# Patient Record
Sex: Female | Born: 1985 | Race: Black or African American | Hispanic: No | Marital: Single | Smoking: Never smoker
Health system: Southern US, Community
[De-identification: ages and names within clinical notes are randomized; demographics above are authoritative.]

## PROBLEM LIST (undated history)

## (undated) DIAGNOSIS — G4700 Insomnia, unspecified: Secondary | ICD-10-CM

## (undated) DIAGNOSIS — Q213 Tetralogy of Fallot: Secondary | ICD-10-CM

## (undated) DIAGNOSIS — G43909 Migraine, unspecified, not intractable, without status migrainosus: Secondary | ICD-10-CM

---

## 2016-08-18 ENCOUNTER — Inpatient Hospital Stay (HOSPITAL_COMMUNITY): Payer: Self-pay

## 2016-08-18 ENCOUNTER — Emergency Department (HOSPITAL_COMMUNITY): Payer: Self-pay

## 2016-08-18 ENCOUNTER — Inpatient Hospital Stay (HOSPITAL_COMMUNITY)
Admit: 2016-08-18 | Discharge: 2016-08-18 | Disposition: A | Discharge status: Home / Self Care | Payer: Self-pay | Attending: Emergency Medicine | Admitting: Emergency Medicine

## 2016-08-18 ENCOUNTER — Encounter (HOSPITAL_COMMUNITY): Payer: Self-pay | Admitting: Physician Assistant

## 2016-08-18 ENCOUNTER — Inpatient Hospital Stay (HOSPITAL_COMMUNITY)
Admission: EM | Admit: 2016-08-18 | Discharge: 2016-08-30 | DRG: 296 | Disposition: E | Payer: Self-pay | Attending: Pulmonary Disease | Admitting: Pulmonary Disease

## 2016-08-18 DIAGNOSIS — R748 Abnormal levels of other serum enzymes: Secondary | ICD-10-CM

## 2016-08-18 DIAGNOSIS — Y900 Blood alcohol level of less than 20 mg/100 ml: Secondary | ICD-10-CM | POA: Diagnosis present

## 2016-08-18 DIAGNOSIS — J80 Acute respiratory distress syndrome: Secondary | ICD-10-CM

## 2016-08-18 DIAGNOSIS — J9601 Acute respiratory failure with hypoxia: Secondary | ICD-10-CM | POA: Diagnosis present

## 2016-08-18 DIAGNOSIS — J9690 Respiratory failure, unspecified, unspecified whether with hypoxia or hypercapnia: Secondary | ICD-10-CM

## 2016-08-18 DIAGNOSIS — R740 Nonspecific elevation of levels of transaminase and lactic acid dehydrogenase [LDH]: Secondary | ICD-10-CM | POA: Diagnosis present

## 2016-08-18 DIAGNOSIS — F419 Anxiety disorder, unspecified: Secondary | ICD-10-CM | POA: Diagnosis present

## 2016-08-18 DIAGNOSIS — Z66 Do not resuscitate: Secondary | ICD-10-CM | POA: Diagnosis present

## 2016-08-18 DIAGNOSIS — R739 Hyperglycemia, unspecified: Secondary | ICD-10-CM | POA: Diagnosis present

## 2016-08-18 DIAGNOSIS — N179 Acute kidney failure, unspecified: Secondary | ICD-10-CM

## 2016-08-18 DIAGNOSIS — I469 Cardiac arrest, cause unspecified: Secondary | ICD-10-CM

## 2016-08-18 DIAGNOSIS — G931 Anoxic brain damage, not elsewhere classified: Secondary | ICD-10-CM | POA: Diagnosis present

## 2016-08-18 DIAGNOSIS — N170 Acute kidney failure with tubular necrosis: Secondary | ICD-10-CM | POA: Diagnosis present

## 2016-08-18 DIAGNOSIS — J9600 Acute respiratory failure, unspecified whether with hypoxia or hypercapnia: Secondary | ICD-10-CM

## 2016-08-18 DIAGNOSIS — I4510 Unspecified right bundle-branch block: Secondary | ICD-10-CM | POA: Diagnosis present

## 2016-08-18 DIAGNOSIS — E875 Hyperkalemia: Secondary | ICD-10-CM | POA: Diagnosis not present

## 2016-08-18 DIAGNOSIS — Z8774 Personal history of (corrected) congenital malformations of heart and circulatory system: Secondary | ICD-10-CM

## 2016-08-18 DIAGNOSIS — E872 Acidosis, unspecified: Secondary | ICD-10-CM

## 2016-08-18 DIAGNOSIS — J14 Pneumonia due to Hemophilus influenzae: Secondary | ICD-10-CM | POA: Diagnosis present

## 2016-08-18 DIAGNOSIS — D649 Anemia, unspecified: Secondary | ICD-10-CM | POA: Diagnosis present

## 2016-08-18 DIAGNOSIS — R0902 Hypoxemia: Secondary | ICD-10-CM

## 2016-08-18 DIAGNOSIS — R6520 Severe sepsis without septic shock: Secondary | ICD-10-CM | POA: Diagnosis present

## 2016-08-18 DIAGNOSIS — A419 Sepsis, unspecified organism: Secondary | ICD-10-CM | POA: Diagnosis present

## 2016-08-18 DIAGNOSIS — E8770 Fluid overload, unspecified: Secondary | ICD-10-CM | POA: Diagnosis not present

## 2016-08-18 DIAGNOSIS — G9340 Encephalopathy, unspecified: Secondary | ICD-10-CM

## 2016-08-18 DIAGNOSIS — R778 Other specified abnormalities of plasma proteins: Secondary | ICD-10-CM

## 2016-08-18 DIAGNOSIS — E876 Hypokalemia: Secondary | ICD-10-CM | POA: Diagnosis not present

## 2016-08-18 DIAGNOSIS — E874 Mixed disorder of acid-base balance: Secondary | ICD-10-CM | POA: Diagnosis present

## 2016-08-18 DIAGNOSIS — Q213 Tetralogy of Fallot: Secondary | ICD-10-CM

## 2016-08-18 DIAGNOSIS — D65 Disseminated intravascular coagulation [defibrination syndrome]: Secondary | ICD-10-CM | POA: Diagnosis present

## 2016-08-18 DIAGNOSIS — I351 Nonrheumatic aortic (valve) insufficiency: Secondary | ICD-10-CM | POA: Diagnosis present

## 2016-08-18 DIAGNOSIS — R011 Cardiac murmur, unspecified: Secondary | ICD-10-CM

## 2016-08-18 DIAGNOSIS — E162 Hypoglycemia, unspecified: Secondary | ICD-10-CM | POA: Diagnosis not present

## 2016-08-18 DIAGNOSIS — Z515 Encounter for palliative care: Secondary | ICD-10-CM | POA: Diagnosis present

## 2016-08-18 DIAGNOSIS — K7200 Acute and subacute hepatic failure without coma: Secondary | ICD-10-CM | POA: Diagnosis not present

## 2016-08-18 DIAGNOSIS — Z9119 Patient's noncompliance with other medical treatment and regimen: Secondary | ICD-10-CM

## 2016-08-18 DIAGNOSIS — R7989 Other specified abnormal findings of blood chemistry: Secondary | ICD-10-CM

## 2016-08-18 DIAGNOSIS — G9341 Metabolic encephalopathy: Secondary | ICD-10-CM | POA: Diagnosis present

## 2016-08-18 DIAGNOSIS — D689 Coagulation defect, unspecified: Secondary | ICD-10-CM | POA: Diagnosis present

## 2016-08-18 DIAGNOSIS — L8990 Pressure ulcer of unspecified site, unspecified stage: Secondary | ICD-10-CM | POA: Insufficient documentation

## 2016-08-18 DIAGNOSIS — F10129 Alcohol abuse with intoxication, unspecified: Secondary | ICD-10-CM | POA: Diagnosis present

## 2016-08-18 HISTORY — DX: Migraine, unspecified, not intractable, without status migrainosus: G43.909

## 2016-08-18 HISTORY — DX: Insomnia, unspecified: G47.00

## 2016-08-18 HISTORY — DX: Tetralogy of Fallot: Q21.3

## 2016-08-18 LAB — POCT I-STAT 3, ART BLOOD GAS (G3+)
ACID-BASE DEFICIT: 4 mmol/L — AB (ref 0.0–2.0)
BICARBONATE: 19 mmol/L — AB (ref 20.0–28.0)
O2 SAT: 100 %
PH ART: 7.55 — AB (ref 7.350–7.450)
PO2 ART: 365 mmHg — AB (ref 83.0–108.0)
Patient temperature: 33.1
TCO2: 20 mmol/L (ref 0–100)
pCO2 arterial: 21 mmHg — ABNORMAL LOW (ref 32.0–48.0)

## 2016-08-18 LAB — CBC WITH DIFFERENTIAL/PLATELET
BASOS ABS: 0 10*3/uL (ref 0.0–0.1)
Basophils Relative: 0 %
Eosinophils Absolute: 0 10*3/uL (ref 0.0–0.7)
Eosinophils Relative: 0 %
HEMATOCRIT: 31.4 % — AB (ref 36.0–46.0)
HEMOGLOBIN: 8.5 g/dL — AB (ref 12.0–15.0)
LYMPHS ABS: 1.6 10*3/uL (ref 0.7–4.0)
Lymphocytes Relative: 6 %
MCH: 21.5 pg — AB (ref 26.0–34.0)
MCHC: 27.1 g/dL — AB (ref 30.0–36.0)
MCV: 79.3 fL (ref 78.0–100.0)
MONOS PCT: 6 %
Monocytes Absolute: 1.6 10*3/uL — ABNORMAL HIGH (ref 0.1–1.0)
NEUTROS ABS: 23.1 10*3/uL — AB (ref 1.7–7.7)
Neutrophils Relative %: 88 %
Platelets: 165 10*3/uL (ref 150–400)
RBC: 3.96 MIL/uL (ref 3.87–5.11)
RDW: 18.8 % — ABNORMAL HIGH (ref 11.5–15.5)
WBC: 26.3 10*3/uL — ABNORMAL HIGH (ref 4.0–10.5)

## 2016-08-18 LAB — CBC
HCT: 27.2 % — ABNORMAL LOW (ref 36.0–46.0)
Hemoglobin: 7.7 g/dL — ABNORMAL LOW (ref 12.0–15.0)
MCH: 21.6 pg — AB (ref 26.0–34.0)
MCHC: 28.3 g/dL — AB (ref 30.0–36.0)
MCV: 76.4 fL — ABNORMAL LOW (ref 78.0–100.0)
PLATELETS: 152 10*3/uL (ref 150–400)
RBC: 3.56 MIL/uL — ABNORMAL LOW (ref 3.87–5.11)
RDW: 18.4 % — AB (ref 11.5–15.5)
WBC: 18.8 10*3/uL — ABNORMAL HIGH (ref 4.0–10.5)

## 2016-08-18 LAB — PROTIME-INR
INR: 4.16
INR: 7.41 — AB
INR: 3.21
PROTHROMBIN TIME: 41.3 s — AB (ref 11.4–15.2)
PROTHROMBIN TIME: 33.5 s — AB (ref 11.4–15.2)
Prothrombin Time: 65.5 seconds — ABNORMAL HIGH (ref 11.4–15.2)

## 2016-08-18 LAB — I-STAT CG4 LACTIC ACID, ED: Lactic Acid, Venous: 16.42 mmol/L (ref 0.5–1.9)

## 2016-08-18 LAB — POCT I-STAT, CHEM 8
BUN: 14 mg/dL (ref 6–20)
BUN: 20 mg/dL (ref 6–20)
BUN: 26 mg/dL — ABNORMAL HIGH (ref 6–20)
BUN: 28 mg/dL — AB (ref 6–20)
CALCIUM ION: 0.79 mmol/L — AB (ref 1.15–1.40)
CALCIUM ION: 0.83 mmol/L — AB (ref 1.15–1.40)
CHLORIDE: 96 mmol/L — AB (ref 101–111)
CHLORIDE: 99 mmol/L — AB (ref 101–111)
CREATININE: 4 mg/dL — AB (ref 0.44–1.00)
CREATININE: 2.7 mg/dL — AB (ref 0.44–1.00)
Calcium, Ion: 0.77 mmol/L — CL (ref 1.15–1.40)
Calcium, Ion: 0.64 mmol/L — CL (ref 1.15–1.40)
Chloride: 102 mmol/L (ref 101–111)
Chloride: 93 mmol/L — ABNORMAL LOW (ref 101–111)
Creatinine, Ser: 2.1 mg/dL — ABNORMAL HIGH (ref 0.44–1.00)
Creatinine, Ser: 3.4 mg/dL — ABNORMAL HIGH (ref 0.44–1.00)
GLUCOSE: 387 mg/dL — AB (ref 65–99)
Glucose, Bld: 239 mg/dL — ABNORMAL HIGH (ref 65–99)
Glucose, Bld: 315 mg/dL — ABNORMAL HIGH (ref 65–99)
Glucose, Bld: 404 mg/dL — ABNORMAL HIGH (ref 65–99)
HCT: 23 % — ABNORMAL LOW (ref 36.0–46.0)
HCT: 23 % — ABNORMAL LOW (ref 36.0–46.0)
HEMATOCRIT: 22 % — AB (ref 36.0–46.0)
HEMATOCRIT: 30 % — AB (ref 36.0–46.0)
HEMOGLOBIN: 10.2 g/dL — AB (ref 12.0–15.0)
Hemoglobin: 7.5 g/dL — ABNORMAL LOW (ref 12.0–15.0)
Hemoglobin: 7.8 g/dL — ABNORMAL LOW (ref 12.0–15.0)
Hemoglobin: 7.8 g/dL — ABNORMAL LOW (ref 12.0–15.0)
POTASSIUM: 3.3 mmol/L — AB (ref 3.5–5.1)
Potassium: 3.8 mmol/L (ref 3.5–5.1)
Potassium: 3.5 mmol/L (ref 3.5–5.1)
Potassium: 3 mmol/L — ABNORMAL LOW (ref 3.5–5.1)
SODIUM: 139 mmol/L (ref 135–145)
SODIUM: 139 mmol/L (ref 135–145)
Sodium: 139 mmol/L (ref 135–145)
Sodium: 139 mmol/L (ref 135–145)
TCO2: 12 mmol/L (ref 0–100)
TCO2: 17 mmol/L (ref 0–100)
TCO2: 20 mmol/L (ref 0–100)
TCO2: 23 mmol/L (ref 0–100)

## 2016-08-18 LAB — BASIC METABOLIC PANEL
Anion gap: 21 — ABNORMAL HIGH (ref 5–15)
Anion gap: 24 — ABNORMAL HIGH (ref 5–15)
BUN: 23 mg/dL — ABNORMAL HIGH (ref 6–20)
BUN: 18 mg/dL (ref 6–20)
CALCIUM: 6.8 mg/dL — AB (ref 8.9–10.3)
CALCIUM: 6.1 mg/dL — AB (ref 8.9–10.3)
CO2: 7 mmol/L — AB (ref 22–32)
CO2: 17 mmol/L — AB (ref 22–32)
CREATININE: 4.06 mg/dL — AB (ref 0.44–1.00)
CREATININE: 2.98 mg/dL — AB (ref 0.44–1.00)
Chloride: 108 mmol/L (ref 101–111)
Chloride: 95 mmol/L — ABNORMAL LOW (ref 101–111)
GFR calc Af Amer: 16 mL/min — ABNORMAL LOW (ref >60)
GFR calc Af Amer: 23 mL/min — ABNORMAL LOW (ref >60)
GFR, EST NON AFRICAN AMERICAN: 14 mL/min — AB (ref >60)
GFR, EST NON AFRICAN AMERICAN: 20 mL/min — AB (ref >60)
GLUCOSE: 345 mg/dL — AB (ref 65–99)
GLUCOSE: 326 mg/dL — AB (ref 65–99)
Potassium: 6.1 mmol/L — ABNORMAL HIGH (ref 3.5–5.1)
Potassium: 2.9 mmol/L — ABNORMAL LOW (ref 3.5–5.1)
Sodium: 136 mmol/L (ref 135–145)
Sodium: 136 mmol/L (ref 135–145)

## 2016-08-18 LAB — HCG, QUANTITATIVE, PREGNANCY

## 2016-08-18 LAB — GLUCOSE, CAPILLARY
GLUCOSE-CAPILLARY: 349 mg/dL — AB (ref 65–99)
GLUCOSE-CAPILLARY: 356 mg/dL — AB (ref 65–99)
GLUCOSE-CAPILLARY: 328 mg/dL — AB (ref 65–99)
GLUCOSE-CAPILLARY: 271 mg/dL — AB (ref 65–99)
Glucose-Capillary: 232 mg/dL — ABNORMAL HIGH (ref 65–99)
Glucose-Capillary: 249 mg/dL — ABNORMAL HIGH (ref 65–99)
Glucose-Capillary: 258 mg/dL — ABNORMAL HIGH (ref 65–99)
Glucose-Capillary: 282 mg/dL — ABNORMAL HIGH (ref 65–99)
Glucose-Capillary: 316 mg/dL — ABNORMAL HIGH (ref 65–99)
Glucose-Capillary: 329 mg/dL — ABNORMAL HIGH (ref 65–99)
Glucose-Capillary: 360 mg/dL — ABNORMAL HIGH (ref 65–99)
Glucose-Capillary: 379 mg/dL — ABNORMAL HIGH (ref 65–99)
Glucose-Capillary: 382 mg/dL — ABNORMAL HIGH (ref 65–99)
Glucose-Capillary: 387 mg/dL — ABNORMAL HIGH (ref 65–99)
Glucose-Capillary: 407 mg/dL — ABNORMAL HIGH (ref 65–99)

## 2016-08-18 LAB — LACTIC ACID, PLASMA: Lactic Acid, Venous: 13.8 mmol/L (ref 0.5–1.9)

## 2016-08-18 LAB — I-STAT CHEM 8, ED
BUN: 39 mg/dL — ABNORMAL HIGH (ref 6–20)
CHLORIDE: 100 mmol/L — AB (ref 101–111)
CREATININE: 4.4 mg/dL — AB (ref 0.44–1.00)
Calcium, Ion: 0.78 mmol/L — CL (ref 1.15–1.40)
GLUCOSE: 256 mg/dL — AB (ref 65–99)
HCT: 32 % — ABNORMAL LOW (ref 36.0–46.0)
Hemoglobin: 10.9 g/dL — ABNORMAL LOW (ref 12.0–15.0)
POTASSIUM: 5.6 mmol/L — AB (ref 3.5–5.1)
Sodium: 134 mmol/L — ABNORMAL LOW (ref 135–145)
TCO2: 12 mmol/L (ref 0–100)

## 2016-08-18 LAB — RAPID URINE DRUG SCREEN, HOSP PERFORMED
AMPHETAMINES: NOT DETECTED
Amphetamines: NOT DETECTED
BARBITURATES: NOT DETECTED
Barbiturates: NOT DETECTED
Benzodiazepines: POSITIVE — AB
Benzodiazepines: POSITIVE — AB
COCAINE: NOT DETECTED
COCAINE: NOT DETECTED
OPIATES: NOT DETECTED
Opiates: NOT DETECTED
TETRAHYDROCANNABINOL: POSITIVE — AB
Tetrahydrocannabinol: POSITIVE — AB

## 2016-08-18 LAB — D-DIMER, QUANTITATIVE (NOT AT ARMC)

## 2016-08-18 LAB — I-STAT ARTERIAL BLOOD GAS, ED
ACID-BASE DEFICIT: 24 mmol/L — AB (ref 0.0–2.0)
Bicarbonate: 7.4 mmol/L — ABNORMAL LOW (ref 20.0–28.0)
O2 SAT: 75 %
PCO2 ART: 45.6 mmHg (ref 32.0–48.0)
PH ART: 6.819 — AB (ref 7.350–7.450)
PO2 ART: 72 mmHg — AB (ref 83.0–108.0)
Patient temperature: 98.6
TCO2: 9 mmol/L (ref 0–100)

## 2016-08-18 LAB — COMPREHENSIVE METABOLIC PANEL
ALK PHOS: 66 U/L (ref 38–126)
ALT: 1983 U/L — ABNORMAL HIGH (ref 14–54)
ANION GAP: 24 — AB (ref 5–15)
AST: 6017 U/L — ABNORMAL HIGH (ref 15–41)
Albumin: 2.7 g/dL — ABNORMAL LOW (ref 3.5–5.0)
BILIRUBIN TOTAL: 0.5 mg/dL (ref 0.3–1.2)
BUN: 26 mg/dL — ABNORMAL HIGH (ref 6–20)
CALCIUM: 6.5 mg/dL — AB (ref 8.9–10.3)
CO2: 11 mmol/L — ABNORMAL LOW (ref 22–32)
Chloride: 101 mmol/L (ref 101–111)
Creatinine, Ser: 4.39 mg/dL — ABNORMAL HIGH (ref 0.44–1.00)
GFR calc non Af Amer: 13 mL/min — ABNORMAL LOW (ref >60)
GFR, EST AFRICAN AMERICAN: 15 mL/min — AB (ref >60)
Glucose, Bld: 287 mg/dL — ABNORMAL HIGH (ref 65–99)
Potassium: 5.9 mmol/L — ABNORMAL HIGH (ref 3.5–5.1)
SODIUM: 136 mmol/L (ref 135–145)
TOTAL PROTEIN: 5.3 g/dL — AB (ref 6.5–8.1)

## 2016-08-18 LAB — URINALYSIS, ROUTINE W REFLEX MICROSCOPIC
Glucose, UA: NEGATIVE mg/dL
KETONES UR: 15 mg/dL — AB
NITRITE: POSITIVE — AB
PH: 5 (ref 5.0–8.0)
Protein, ur: 100 mg/dL — AB
SPECIFIC GRAVITY, URINE: 1.018 (ref 1.005–1.030)

## 2016-08-18 LAB — PROCALCITONIN: PROCALCITONIN: 49.68 ng/mL

## 2016-08-18 LAB — ECHOCARDIOGRAM COMPLETE: Weight: 2000 oz

## 2016-08-18 LAB — TROPONIN I
Troponin I: 3.27 ng/mL (ref <0.03)
Troponin I: 5 ng/mL (ref <0.03)
Troponin I: 6.18 ng/mL (ref <0.03)

## 2016-08-18 LAB — RENAL FUNCTION PANEL
ANION GAP: 21 — AB (ref 5–15)
Albumin: 1.8 g/dL — ABNORMAL LOW (ref 3.5–5.0)
BUN: 24 mg/dL — ABNORMAL HIGH (ref 6–20)
CO2: 11 mmol/L — AB (ref 22–32)
Calcium: 5.4 mg/dL — CL (ref 8.9–10.3)
Chloride: 104 mmol/L (ref 101–111)
Creatinine, Ser: 3.55 mg/dL — ABNORMAL HIGH (ref 0.44–1.00)
GFR calc Af Amer: 19 mL/min — ABNORMAL LOW (ref >60)
GFR calc non Af Amer: 16 mL/min — ABNORMAL LOW (ref >60)
GLUCOSE: 404 mg/dL — AB (ref 65–99)
POTASSIUM: 3.2 mmol/L — AB (ref 3.5–5.1)
Phosphorus: 5.8 mg/dL — ABNORMAL HIGH (ref 2.5–4.6)
Sodium: 136 mmol/L (ref 135–145)

## 2016-08-18 LAB — CBG MONITORING, ED
GLUCOSE-CAPILLARY: 59 mg/dL — AB (ref 65–99)
Glucose-Capillary: 230 mg/dL — ABNORMAL HIGH (ref 65–99)

## 2016-08-18 LAB — SALICYLATE LEVEL
SALICYLATE LVL: 13.1 mg/dL (ref 2.8–30.0)
Salicylate Lvl: <7 mg/dL (ref 2.8–30.0)

## 2016-08-18 LAB — ACETAMINOPHEN LEVEL: Acetaminophen (Tylenol), Serum: <10 ug/mL — ABNORMAL LOW (ref 10–30)

## 2016-08-18 LAB — I-STAT BETA HCG BLOOD, ED (MC, WL, AP ONLY)

## 2016-08-18 LAB — URINE MICROSCOPIC-ADD ON

## 2016-08-18 LAB — I-STAT TROPONIN, ED: Troponin i, poc: 2.77 ng/mL (ref 0.00–0.08)

## 2016-08-18 LAB — APTT
APTT: 65 s — AB (ref 24–36)
aPTT: 65 seconds — ABNORMAL HIGH (ref 24–36)

## 2016-08-18 LAB — CK: Total CK: 9422 U/L — ABNORMAL HIGH (ref 38–234)

## 2016-08-18 LAB — ETHANOL: Alcohol, Ethyl (B): 18 mg/dL — ABNORMAL HIGH (ref <5)

## 2016-08-18 LAB — ABO/RH: ABO/RH(D): O POS

## 2016-08-18 LAB — MRSA PCR SCREENING: MRSA BY PCR: NEGATIVE

## 2016-08-18 LAB — PREGNANCY, URINE: Preg Test, Ur: NEGATIVE

## 2016-08-18 MED ORDER — VANCOMYCIN HCL 500 MG IV SOLR
500.0000 mg | INTRAVENOUS | Status: DC
  Administered 2016-08-19 – 2016-08-20 (×2): 500 mg via INTRAVENOUS
  Filled 2016-08-18 (×3): qty 500

## 2016-08-18 MED ORDER — CHLORHEXIDINE GLUCONATE 0.12% ORAL RINSE (MEDLINE KIT)
15.0000 mL | Freq: Two times a day (BID) | OROMUCOSAL | Status: DC
  Administered 2016-08-18 – 2016-08-22 (×9): 15 mL via OROMUCOSAL

## 2016-08-18 MED ORDER — POTASSIUM CHLORIDE 10 MEQ/50ML IV SOLN
10.0000 meq | INTRAVENOUS | Status: AC
  Administered 2016-08-18 (×4): 10 meq via INTRAVENOUS
  Filled 2016-08-18 (×4): qty 50

## 2016-08-18 MED ORDER — SODIUM BICARBONATE 8.4 % IV SOLN
INTRAVENOUS | Status: DC
  Administered 2016-08-18 – 2016-08-19 (×4): via INTRAVENOUS
  Filled 2016-08-18 (×12): qty 150

## 2016-08-18 MED ORDER — PIPERACILLIN-TAZOBACTAM 3.375 G IVPB 30 MIN
3.3750 g | Freq: Once | INTRAVENOUS | Status: AC
  Administered 2016-08-18: 3.375 g via INTRAVENOUS
  Filled 2016-08-18: qty 50

## 2016-08-18 MED ORDER — MIDAZOLAM BOLUS VIA INFUSION
2.0000 mg | INTRAVENOUS | Status: DC | PRN
  Filled 2016-08-18: qty 2

## 2016-08-18 MED ORDER — SODIUM CHLORIDE 0.9 % IV BOLUS (SEPSIS)
1000.0000 mL | Freq: Once | INTRAVENOUS | Status: AC
  Administered 2016-08-18: 1000 mL via INTRAVENOUS

## 2016-08-18 MED ORDER — NOREPINEPHRINE BITARTRATE 1 MG/ML IV SOLN
0.0000 ug/min | INTRAVENOUS | Status: DC
  Filled 2016-08-18: qty 4

## 2016-08-18 MED ORDER — SODIUM BICARBONATE 8.4 % IV SOLN
INTRAVENOUS | Status: AC
  Administered 2016-08-18: 50 meq
  Filled 2016-08-18: qty 100

## 2016-08-18 MED ORDER — CISATRACURIUM BESYLATE (PF) 200 MG/20ML IV SOLN
1.0000 ug/kg/min | INTRAVENOUS | Status: DC
  Administered 2016-08-18: 1 ug/kg/min via INTRAVENOUS
  Filled 2016-08-18 (×2): qty 20

## 2016-08-18 MED ORDER — SODIUM CHLORIDE 0.9 % IV SOLN: 1.0000 g | Freq: Once | INTRAVENOUS | Status: DC

## 2016-08-18 MED ORDER — FENTANYL BOLUS VIA INFUSION
50.0000 ug | INTRAVENOUS | Status: DC | PRN
Start: 2016-08-18 — End: 2016-08-20
  Filled 2016-08-18: qty 50

## 2016-08-18 MED ORDER — NOREPINEPHRINE BITARTRATE 1 MG/ML IV SOLN
0.0000 ug/min | Freq: Once | INTRAVENOUS | Status: DC
  Filled 2016-08-18: qty 4

## 2016-08-18 MED ORDER — STERILE WATER FOR INJECTION IV SOLN
INTRAVENOUS | Status: DC
  Administered 2016-08-18: 14:00:00 via INTRAVENOUS_CENTRAL
  Filled 2016-08-18 (×4): qty 150

## 2016-08-18 MED ORDER — PRISMASOL BGK 4/2.5 32-4-2.5 MEQ/L IV SOLN
INTRAVENOUS | Status: DC
  Administered 2016-08-18 – 2016-08-19 (×9): via INTRAVENOUS_CENTRAL
  Filled 2016-08-18 (×15): qty 5000

## 2016-08-18 MED ORDER — SODIUM CHLORIDE 0.9 % IV SOLN
INTRAVENOUS | Status: DC
  Administered 2016-08-18: 09:00:00 via INTRAVENOUS

## 2016-08-18 MED ORDER — SODIUM CHLORIDE 0.9 % IV SOLN
INTRAVENOUS | Status: DC
  Administered 2016-08-18: 2.7 [IU]/h via INTRAVENOUS
  Filled 2016-08-18 (×2): qty 2.5

## 2016-08-18 MED ORDER — PRISMASOL BGK 4/2.5 32-4-2.5 MEQ/L IV SOLN
INTRAVENOUS | Status: DC
  Administered 2016-08-18: 20:00:00 via INTRAVENOUS_CENTRAL
  Filled 2016-08-18 (×2): qty 5000

## 2016-08-18 MED ORDER — CISATRACURIUM BOLUS VIA INFUSION
0.0500 mg/kg | INTRAVENOUS | Status: DC | PRN
Start: 2016-08-18 — End: 2016-08-20
  Filled 2016-08-18: qty 3

## 2016-08-18 MED ORDER — SODIUM BICARBONATE 8.4 % IV SOLN
50.0000 meq | Freq: Once | INTRAVENOUS | Status: AC
  Administered 2016-08-18: 50 meq via INTRAVENOUS

## 2016-08-18 MED ORDER — SODIUM CHLORIDE 0.9 % FOR CRRT
INTRAVENOUS_CENTRAL | Status: DC | PRN
  Filled 2016-08-18: qty 1000

## 2016-08-18 MED ORDER — FENTANYL 2500MCG IN NS 250ML (10MCG/ML) PREMIX INFUSION
100.0000 ug/h | INTRAVENOUS | Status: DC
  Administered 2016-08-18: 100 ug/h via INTRAVENOUS
  Administered 2016-08-19: 200 ug/h via INTRAVENOUS
  Filled 2016-08-18 (×2): qty 250

## 2016-08-18 MED ORDER — SODIUM BICARBONATE 8.4 % IV SOLN
100.0000 meq | Freq: Once | INTRAVENOUS | Status: AC
  Administered 2016-08-18: 50 meq via INTRAVENOUS

## 2016-08-18 MED ORDER — ORAL CARE MOUTH RINSE
15.0000 mL | OROMUCOSAL | Status: DC
  Administered 2016-08-18 – 2016-08-22 (×41): 15 mL via OROMUCOSAL

## 2016-08-18 MED ORDER — FENTANYL CITRATE (PF) 2500 MCG/50ML IJ SOLN
100.0000 ug/h | INTRAMUSCULAR | Status: DC
  Administered 2016-08-18: 100 ug/h via INTRAVENOUS
  Filled 2016-08-18 (×2): qty 50

## 2016-08-18 MED ORDER — SODIUM CHLORIDE 0.9 % IV SOLN
1.0000 g | Freq: Once | INTRAVENOUS | Status: AC
  Administered 2016-08-18: 1 g via INTRAVENOUS
  Filled 2016-08-18: qty 10

## 2016-08-18 MED ORDER — SODIUM CHLORIDE 0.9 % IV BOLUS (SEPSIS): 1000.0000 mL | Freq: Once | INTRAVENOUS | Status: DC

## 2016-08-18 MED ORDER — SODIUM CHLORIDE 0.9 % IV SOLN
Freq: Once | INTRAVENOUS | Status: AC
  Administered 2016-08-18: 11:00:00 via INTRAVENOUS

## 2016-08-18 MED ORDER — SODIUM CHLORIDE 0.9 % IV SOLN
Freq: Once | INTRAVENOUS | Status: AC
  Administered 2016-08-18: 16:00:00 via INTRAVENOUS

## 2016-08-18 MED ORDER — SODIUM CHLORIDE 0.9 % IV SOLN
2.0000 g | Freq: Once | INTRAVENOUS | Status: AC
  Administered 2016-08-18: 2 g via INTRAVENOUS
  Filled 2016-08-18: qty 20

## 2016-08-18 MED ORDER — FAMOTIDINE IN NACL 20-0.9 MG/50ML-% IV SOLN
20.0000 mg | Freq: Two times a day (BID) | INTRAVENOUS | Status: DC
  Administered 2016-08-18: 20 mg via INTRAVENOUS
  Filled 2016-08-18: qty 50

## 2016-08-18 MED ORDER — SODIUM BICARBONATE 8.4 % IV SOLN: 50.0000 meq | Freq: Once | INTRAVENOUS | Status: DC

## 2016-08-18 MED ORDER — FAMOTIDINE IN NACL 20-0.9 MG/50ML-% IV SOLN
20.0000 mg | INTRAVENOUS | Status: DC
  Administered 2016-08-19: 20 mg via INTRAVENOUS
  Filled 2016-08-18: qty 50

## 2016-08-18 MED ORDER — ARTIFICIAL TEARS OP OINT
1.0000 "application " | TOPICAL_OINTMENT | Freq: Three times a day (TID) | OPHTHALMIC | Status: DC
  Administered 2016-08-18 – 2016-08-19 (×5): 1 via OPHTHALMIC
  Filled 2016-08-18: qty 3.5

## 2016-08-18 MED ORDER — CALCIUM CHLORIDE 10 % IV SOLN
INTRAVENOUS | Status: AC
  Administered 2016-08-18: 1000 mg
  Filled 2016-08-18: qty 10

## 2016-08-18 MED ORDER — MIDAZOLAM HCL 2 MG/2ML IJ SOLN: 2.0000 mg | Freq: Once | INTRAMUSCULAR | Status: AC

## 2016-08-18 MED ORDER — NOREPINEPHRINE BITARTRATE 1 MG/ML IV SOLN
0.0000 ug/min | Freq: Once | INTRAVENOUS | Status: AC
  Administered 2016-08-18: 10 ug/min via INTRAVENOUS

## 2016-08-18 MED ORDER — PIPERACILLIN-TAZOBACTAM IN DEX 2-0.25 GM/50ML IV SOLN
2.2500 g | Freq: Four times a day (QID) | INTRAVENOUS | Status: DC
  Administered 2016-08-18 – 2016-08-21 (×11): 2.25 g via INTRAVENOUS
  Filled 2016-08-18 (×14): qty 50

## 2016-08-18 MED ORDER — HEPARIN SODIUM (PORCINE) 5000 UNIT/ML IJ SOLN: 5000.0000 [IU] | Freq: Three times a day (TID) | INTRAMUSCULAR | Status: DC

## 2016-08-18 MED ORDER — HEPARIN SODIUM (PORCINE) 1000 UNIT/ML DIALYSIS
1000.0000 [IU] | INTRAMUSCULAR | Status: DC | PRN
  Administered 2016-08-21 – 2016-08-22 (×2): 2800 [IU] via INTRAVENOUS_CENTRAL
  Filled 2016-08-18: qty 6
  Filled 2016-08-18: qty 3
  Filled 2016-08-18: qty 4
  Filled 2016-08-18: qty 6
  Filled 2016-08-18: qty 4
  Filled 2016-08-18: qty 6

## 2016-08-18 MED ORDER — DEXTROSE 50 % IV SOLN
1.0000 | Freq: Once | INTRAVENOUS | Status: DC
Start: 2016-08-18 — End: 2016-08-18

## 2016-08-18 MED ORDER — SODIUM CHLORIDE 0.9 % IV SOLN
INTRAVENOUS | Status: DC
  Administered 2016-08-18: 07:00:00 via INTRAVENOUS

## 2016-08-18 MED ORDER — DEXTROSE 5 % IV SOLN
0.0000 ug/min | INTRAVENOUS | Status: DC
  Administered 2016-08-18 (×2): 7 ug/min via INTRAVENOUS
  Administered 2016-08-20: 5 ug/min via INTRAVENOUS
  Filled 2016-08-18 (×2): qty 16

## 2016-08-18 MED ORDER — DEXTROSE 50 % IV SOLN
INTRAVENOUS | Status: AC
  Filled 2016-08-18: qty 50

## 2016-08-18 MED ORDER — FENTANYL CITRATE (PF) 100 MCG/2ML IJ SOLN: 100.0000 ug | Freq: Once | INTRAMUSCULAR | Status: AC

## 2016-08-18 MED ORDER — SODIUM CHLORIDE 0.9 % IV SOLN
2.0000 mg/h | INTRAVENOUS | Status: DC
  Administered 2016-08-18 (×2): 2 mg/h via INTRAVENOUS
  Administered 2016-08-19 (×2): 6 mg/h via INTRAVENOUS
  Filled 2016-08-18 (×4): qty 10

## 2016-08-18 MED ORDER — ASPIRIN 300 MG RE SUPP
300.0000 mg | RECTAL | Status: AC
  Administered 2016-08-18: 300 mg via RECTAL
  Filled 2016-08-18: qty 1

## 2016-08-18 MED ORDER — VANCOMYCIN HCL IN DEXTROSE 1-5 GM/200ML-% IV SOLN
1000.0000 mg | Freq: Once | INTRAVENOUS | Status: AC
  Administered 2016-08-18: 1000 mg via INTRAVENOUS
  Filled 2016-08-18: qty 200

## 2016-08-18 MED ORDER — DEXTROSE 50 % IV SOLN
1.0000 | Freq: Once | INTRAVENOUS | Status: AC
  Administered 2016-08-18: 50 mL via INTRAVENOUS

## 2016-08-18 MED ORDER — CISATRACURIUM BOLUS VIA INFUSION
0.1000 mg/kg | Freq: Once | INTRAVENOUS | Status: AC
  Administered 2016-08-18: 5.7 mg via INTRAVENOUS
  Filled 2016-08-18: qty 6

## 2016-08-18 MED ORDER — SODIUM BICARBONATE 8.4 % IV SOLN
INTRAVENOUS | Status: AC
  Filled 2016-08-18: qty 50

## 2016-08-18 NOTE — Procedures (Signed)
Central Venous Catheter Insertion Procedure Note Posey ReaJenae Wieser [id number] 1986-05-29  Procedure: Insertion of Central Venous Catheter Indications: Assessment of intravascular volume, Drug and/or fluid administration and Frequent blood sampling  Procedure Details Consent: Unable to obtain consent because of emergent medical necessity. Time Out: Verified patient identification, verified procedure, site/side was marked, verified correct patient position, special equipment/implants available, medications/allergies/relevent history reviewed, required imaging and test results available.  Performed  Maximum sterile technique was used including antiseptics, cap, gloves, gown, hand hygiene, mask and sheet. Skin prep: Chlorhexidine; local anesthetic administered A antimicrobial bonded/coated triple lumen catheter was placed in the left internal jugular vein using the Seldinger technique. Ultrasound guidance used.Yes.   Catheter placed to 20 cm. Blood aspirated via all 3 ports and then flushed x 3. Line sutured x 2 and dressing applied.  Evaluation Blood flow good Complications: No apparent complications Patient did tolerate procedure well. Chest X-ray ordered to verify placement.  CXR: pending.  Brett CanalesSteve Minor ACNP [facility] PCCM Pager [phone] till 3 pm If no answer page [phone] 08/13/2016, 7:58 AM

## 2016-08-18 NOTE — ED Notes (Signed)
Brett CanalesSteve minor critical care at bedside placing central line

## 2016-08-18 NOTE — Progress Notes (Signed)
Initial Nutrition Assessment  INTERVENTION:   Once rewarmed recommend: Vital AF 1.2 @ 50 ml/hr (1200 ml/day) Provides: 1440 kcal, 90 grams protein, and 973 ml H2O.   NUTRITION DIAGNOSIS:   Inadequate oral intake related to inability to eat as evidenced by NPO status.  GOAL:   Patient will meet greater than or equal to 90% of their needs  MONITOR:   I & O's, Labs, Vent status  REASON FOR ASSESSMENT:   Ventilator    ASSESSMENT:   Pt with unknown cardiac hx admitted for cardiac arrest with unknown downtime.   Pt living with roommate who is not here, no family yet.   Patient is currently intubated on ventilator support MV: 12 L/min Temp (24hrs), Avg:89.9 F (32.2 C), Min:88.3 F (31.3 C), Max:91.4 F (33 C)  Medications reviewed and include: levophed, Na bicarb Labs reviewed: Na 134, K+ 5.6, lactic acid: 16.42 CBG: 349 Nutrition-Focused physical exam completed. Findings are no fat depletion, no muscle depletion, and mild edema.  OG tube   Diet Order:    NPO  Skin:  Reviewed, no issues  Last BM:  unknown  Height:   Ht Readings from Last 1 Encounters:  08/14/16 5\' 3"  (1.6 m)    Weight:   Wt Readings from Last 1 Encounters:  08/14/16 110 lb 14.3 oz (50.3 kg)    Ideal Body Weight:  52.2 kg  BMI:  Body mass index is 19.64 kg/m.  Estimated Nutritional Needs:   Kcal:  1489  Protein:  70-85 grams  Fluid:  > 1.5 L/day  EDUCATION NEEDS:   No education needs identified at this time  Kendell BaneHeather Rajni Holsworth RD, LDN, CNSC [phone] Pager [phone] After Hours Pager

## 2016-08-18 NOTE — Progress Notes (Signed)
Preliminary results by tech: Bilateral lower extremity duplex complete. Visualized lower extremity veins negative for deep and superficial vein thrombosis. HD Cath noted in left groin. Bandages obscuring visualization of right CFV/GSV. Negative for baker cysts bilaterally. Levin Baconlaire Veto Macqueen- RDMS, RVT 1:52 PM  02/08/2016

## 2016-08-18 NOTE — Procedures (Signed)
ELECTROENCEPHALOGRAM REPORT  Date of Study: 08/15/2016  Patient's Name: Barbara [name] MRN: [id number] Date of Birth: 1986/01/27  Referring Provider: Dr. Max Fickleouglas McQuaid  Clinical History: This is a 30 year old woman s/p cardiac arrest, on hypothermia protocol  Medications: fentaNYL (SUBLIMAZE) 2,500 mcg in sodium chloride 0.9 % 250 mL (10 mcg/mL) infusion midazolam  cisatracurium (NIMBEX) bolus via infusion 2.8 mg  famotidine (PEPCID) IVPB 20 mg premix  insulin regular (NOVOLIN R,HUMULIN R) 250 Units in sodium chloride 0.9 % 250 mL (1 Units/mL) infusion  norepinephrine (LEVOPHED) 4 mg in dextrose 5 % 250 mL (0.016 mg/mL) infusion   Technical Summary: A multichannel digital EEG recording measured by the international 10-20 system with electrodes applied with paste and impedances below 5000 ohms performed in our laboratory with EKG monitoring in an intubated and sedated patient on hypothermia protocol at 32.7 degrees Celsius.  Hyperventilation and photic stimulation were not performed.  The digital EEG was referentially recorded, reformatted, and digitally filtered in a variety of bipolar and referential montages for optimal display.    Description: The patient is intubated and sedated on Fentanyl and Versed during the recording on cooling protocol. There is loss of normal background activity. The records read at a sensitivity of 3 uV/mm shows diffuse suppression of background activity. There is no spontaneous reactivity or reactivity noted with stimulation. Hyperventilation and photic stimulation were not performed. There were no epileptiform discharges or electrographic seizures seen.   EKG lead was unremarkable.  Impression: This EEG is markedly abnormal due to diffuse background suppression and lack of EEG reactivity with stimulation.  Clinical Correlation of the above findings indicates severe diffuse cerebral dysfunction that is non-specific in etiology and can be seen in the  setting of anoxic/ischemic injury, toxic/metabolic encephalopathies, or medication effect. In the absence of CNS active, sedating, or anesthetic medications, this suggests a poor prognosis. Clinical correlation is advised.  Patrcia DollyKaren Aquino, M.D.

## 2016-08-18 NOTE — Progress Notes (Signed)
   08/21/2016 0600  Clinical Encounter Type  Visited With Family  Visit Type Initial;Code;ED  Referral From Nurse  Spiritual Encounters  Spiritual Needs Emotional  Stress Factors  Patient Stress Factors None identified  Family Stress Factors Exhausted    Chaplain responded to page from Ed. Pt post CPR in critical condition. Roommate from transitional housing facility reported arrest and was present for emotional support for patient. No family present, mom en route from unknown location. Escorted friend to consult B and awaiting physician's updates.
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
## 2016-08-18 NOTE — Progress Notes (Signed)
Reviewed updated history with Dr. 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 At present time no new recs to add other than to continue supportive care. EEG results noted. Dayna Dunn PA-C

## 2016-08-18 NOTE — Progress Notes (Signed)
EEG completed; results pending.    

## 2016-08-18 NOTE — ED Notes (Signed)
ICU nurses attempted foley x3. Vaginal discharge noted.

## 2016-08-18 NOTE — ED Notes (Signed)
Pads placed on pt 

## 2016-08-18 NOTE — Progress Notes (Signed)
RT attempted x 2 for radial ABG without success.  No pulses located using a doppler. CCM notified.

## 2016-08-18 NOTE — ED Notes (Signed)
Amanda stemi team at bedside

## 2016-08-18 NOTE — Progress Notes (Signed)
Foley catheter attempted three times by two different RN Rocco Pauls(Sheyenne Konz & Devota PaceKaylee Quick) Unable to visualize or place in urethra.  Multiple perineal care down with discharge and swelling noted and perineal area easily irritated with some bleeding after wiping/cleaning. MD advised.

## 2016-08-18 NOTE — Consult Note (Addendum)
Referring Provider: No ref. provider found Primary Care Physician:  No primary care provider on file. Primary Nephrologist:     Reason for Consultation:  Metabolic acidosis acute renal failure and hyperkalemia   HPI:  This is a young lady with history of Tetralogy of Fallot  That was found down for an unspecified length of time. She  Was found at 5.30 am by a roommate and EMS was called. She was found to be in PEA arrest and underwent 3 doses of Epi and 3shocks VF and VT  And was found to be in sinus tachycardia . She also received D 10 and also Narcan. Bottle of ativan found in room and EtOH level of 18.    Past Medical History:  Diagnosis Date  . Insomnia   . Migraines     No past surgical history on file.  Prior to Admission medications   Not on File    Current Facility-Administered Medications  Medication Dose Route Frequency Provider Last Rate Last Dose  . 0.9 %  sodium chloride infusion   Intravenous Continuous Juanito Doom, MD 50 mL/hr at 08/06/2016 0845    . artificial tears (LACRILUBE) ophthalmic ointment 1 application  1 application Both Eyes E7N Juanito Doom, MD   1 application at 17/00/17 1000  . chlorhexidine gluconate (MEDLINE KIT) (PERIDEX) 0.12 % solution 15 mL  15 mL Mouth Rinse BID Mulford, MD   15 mL at 08/09/2016 0900  . cisatracurium (NIMBEX) 200 mg in sodium chloride 0.9 % 200 mL (1 mg/mL) infusion  1-1.5 mcg/kg/min Intravenous Continuous Juanito Doom, MD 3.4 mL/hr at 08/11/2016 0845 1 mcg/kg/min at 08/08/2016 0845   And  . cisatracurium (NIMBEX) bolus via infusion 2.8 mg  0.05 mg/kg Intravenous PRN Juanito Doom, MD      . famotidine (PEPCID) IVPB 20 mg premix  20 mg Intravenous Q12H Juanito Doom, MD   20 mg at 08/21/2016 1046  . fentaNYL (SUBLIMAZE) 2,500 mcg in sodium chloride 0.9 % 250 mL (10 mcg/mL) infusion  100-300 mcg/hr Intravenous Continuous Juanito Doom, MD 10 mL/hr at 08/19/2016 0845 100 mcg/hr at 08/12/2016 0845  .  fentaNYL (SUBLIMAZE) bolus via infusion 50 mcg  50 mcg Intravenous Q30 min PRN Juanito Doom, MD      . heparin injection 1,000-6,000 Units  1,000-6,000 Units CRRT PRN Edrick Oh, MD      . insulin regular (NOVOLIN R,HUMULIN R) 250 Units in sodium chloride 0.9 % 250 mL (1 Units/mL) infusion   Intravenous Continuous [place], MD 2.7 mL/hr at 08/11/2016 1044 2.7 Units/hr at 08/16/2016 1044  . MEDLINE mouth rinse  15 mL Mouth Rinse 10 times per day Rush Landmark, MD   15 mL at 08/23/2016 1000  . midazolam (VERSED) 50 mg in sodium chloride 0.9 % 50 mL (1 mg/mL) infusion  2-10 mg/hr Intravenous Continuous Juanito Doom, MD 2 mL/hr at 08/08/2016 0845 2 mg/hr at 08/03/2016 0845  . midazolam (VERSED) bolus via infusion 2 mg  2 mg Intravenous Q30 min PRN Juanito Doom, MD      . norepinephrine (LEVOPHED) 16 mg in dextrose 5 % 250 mL (0.064 mg/mL) infusion  0-40 mcg/min Intravenous Continuous [place], MD      . prismasol BGK 4/2.5 5,000 mL dialysis solution   CRRT Continuous Edrick Oh, MD      . sodium bicarbonate 150 mEq in dextrose 5 %  1,000 mL infusion   Intravenous Continuous Kristen N Ward, DO 250 mL/hr at 08/01/2016 0845    . sodium bicarbonate 150 mEq in sterile water 1,000 mL infusion   CRRT Continuous Edrick Oh, MD      . sodium bicarbonate 150 mEq in sterile water 1,000 mL infusion   CRRT Continuous Edrick Oh, MD      . sodium bicarbonate injection 50 mEq  50 mEq Intravenous Once Kristen N Ward, DO      . sodium chloride 0.9 % primer fluid for CRRT   CRRT PRN Edrick Oh, MD        Allergies as of 07/31/2016 - never reviewed  Allergen Reaction Noted  . Latex Rash 08/20/2016    Family History  Problem Relation Age of Onset  . Family history unknown: Yes    Social History   Social History  . Marital status: Single    Spouse name: N/A  . Number of children: N/A  . Years of education: N/A   Occupational History  . Not on file.   Social History  Main Topics  . Smoking status: Never Smoker  . Smokeless tobacco: Not on file  . Alcohol use Yes     Comment: occasional per roommate, not regularly  . Drug use: No     Comment: no drug use that roommate is aware of  . Sexual activity: Not on file   Other Topics Concern  . Not on file   Social History Narrative  . No narrative on file    Review of Systems: Unobtainable   Physical Exam: Vital signs in last 24 hours: Temp:  [88.3 F (31.3 C)-90.3 F (32.4 C)] 90.3 F (32.4 C) (10/20 1000) Pulse Rate:  [37-102] 81 (10/20 1000) Resp:  [0-32] 23 (10/20 1000) BP: (81-151)/(33-74) 143/70 (10/20 0945) SpO2:  [87 %-100 %] 95 % (10/20 1000) FiO2 (%):  [60 %-100 %] 100 % (10/20 0845) Weight:  [50.3 kg (110 lb 14.3 oz)-56.7 kg (125 lb)] 50.3 kg (110 lb 14.3 oz) (10/20 0835)   General:   Sedated and intubated ,  Well-developed, well-nourished Head:  Normocephalic and atraumatic. Eyes:  Sclera clear, no icterus.   Conjunctiva pink. Ears:  Normal auditory acuity. Nose:  No deformity, discharge,  or lesions. Mouth:  No deformity or lesions, dentition normal.  ET tube Neck:  Supple; no masses or thyromegaly. JVP not elevated Lungs:  Clear throughout to auscultation.   No wheezes, crackles, or rhonchi. No acute distress. Heart:  Regular rate and rhythm; Systolic murmur 3/6  Abdomen:   Distended and hypoactive bowel sounds  Msk:  Symmetrical without gross deformities. Normal posture. Pulses:  No carotid, renal, femoral bruits. DP and PT symmetrical and equal Extremities:  Without clubbing or edema.  .  Intake/Output from previous day: 10/19 0701 - 10/20 0700 In: 2000 [I.V.:2000] Out: -  Intake/Output this shift: Total I/O In: 2551 [I.V.:2551] Out: -   Lab Results:  Recent Labs  08/09/2016 0618 08/24/2016 0634  WBC 26.3*  --   HGB 8.5* 10.9*  HCT 31.4* 32.0*  PLT 165  --    BMET  Recent Labs  08/11/2016 0618 08/17/2016 0634  NA 136 134*  K 5.9* 5.6*  CL 101 100*  CO2  11*  --   GLUCOSE 287* 256*  BUN 26* 39*  CREATININE 4.39* 4.40*  CALCIUM 6.5*  --    LFT  Recent Labs  08/04/2016 0618  PROT 5.3*  ALBUMIN 2.7*  AST 6,017*  ALT 1,983*  ALKPHOS 66  BILITOT 0.5   PT/INR  Recent Labs  08/17/2016 0618  LABPROT 41.3*  INR 4.16*   Hepatitis Panel No results for input(s): HEPBSAG, HCVAB, HEPAIGM, HEPBIGM in the last 72 hours.  Studies/Results: Dg Chest Portable 1 View  Result Date: 08/14/2016 CLINICAL DATA:  30 year old female with a history of post cardiac arrest. Central line placement. EXAM: PORTABLE CHEST 1 VIEW COMPARISON:  08/19/2016 FINDINGS: Persistent cardiomegaly compared to the prior. Surgical changes of likely bilateral pulmonary artery stenting. Endotracheal tube suitably position above the carina, approximately 4.4 cm terminating at the clavicular heads. Interval placement of left IJ central venous catheter which appears to terminate at the superior cavoatrial junction. Gastric tube projects over the mediastinum, terminating out of the field of view in the abdomen. Overlying EKG leads.  Defibrillator pads. Compare to the prior there is low lung volumes with developing airspace interstitial opacities most pronounced at the bases. No pneumothorax.  Interlobular septal thickening. No displaced fracture. IMPRESSION: Compare to the prior plain film there are new airspace opacities at the lung bases. Given the lower lung volumes this may reflect atelectasis, however, additional considerations include developing pulmonary edema, pulmonary contusion, aspiration, and/or pneumonia. Finally, neurogenic edema could also be considered, as the patient has not yet had brain imaging. Unchanged endotracheal tube and gastric tube. Interval placement of left IJ central venous catheter which appears to terminate superior vena cava. Similar appearance of cardiomegaly. If there is concern for pericardial effusion in a patient of this age, recommend correlation with  ECHO. Surgical changes of likely bilateral pulmonary artery stenting. Overlying EKG leads and defibrillator pads. Signed, Dulcy Fanny. Earleen Newport, DO [facility] Radiology Electronically Signed   By: Corrie Mckusick D.O.   On: 08/13/2016 08:32   Dg Chest Portable 1 View  Result Date: 08/13/2016 CLINICAL DATA:  30 year old female with intubation. EXAM: PORTABLE CHEST 1 VIEW COMPARISON:  None. FINDINGS: Endotracheal tube with tip approximately 4 cm above the carina. Enteric tube extends into the left hemi abdomen with tip beyond the inferior margin of the image. The lungs are clear. There is no pleural effusion or pneumothorax. There is moderate cardiomegaly. Bilateral pulmonary artery stents noted. No acute osseous pathology. IMPRESSION: Endotracheal tube above the carina. Cardiomegaly with pulmonary vascular stents. No evidence of congestion or edema. Electronically Signed   By: Anner Crete M.D.   On: 08/19/2016 07:08    Assessment/Plan: 26F with insomnia, migraines,  Tetralogy of Fallot  admitted with cardiac arrest (PEA then VT/VF) with ROSC, acute encephalopathy, acute respiratory failure, and multiorgan failure with with acute renal failure with Cr of 4.39, liver failure with AST 6017 and ALT 1983, troponin of 3.27, hyperkalemia of 5.9, metabolic acidosis, severe lactic acidosis, anemia, autoanticoagulation with INR >4, leukocytosis, negative Tylenol and hCG levels, EtOH 18.  Acute Renal Failure secondary to ATN will proceed with CRRT today  Metabolic Acidosis  Will use bicarbonate replacement fluid  Hyperkalemia   Place on CRRT   Prognosis may be poor and family are aware    LOS: 0 Will Schier W @TODAY @10 :48 AM
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
## 2016-08-18 NOTE — Procedures (Signed)
Hemodialysis Insertion Procedure Note Posey ReaJenae Michelin [id number] [birth year]  Procedure: Insertion of Hemodialysis Catheter Type: 3 port  Indications: Hemodialysis   Procedure Details Consent: Risks of procedure as well as the alternatives and risks of each were explained to the (patient/caregiver).  Consent for procedure obtained. Time Out: Verified patient identification, verified procedure, site/side was marked, verified correct patient position, special equipment/implants available, medications/allergies/relevent history reviewed, required imaging and test results available.  Performed  Maximum sterile technique was used including antiseptics, cap, gloves, gown, hand hygiene, mask and sheet. Skin prep: Chlorhexidine; local anesthetic administered A antimicrobial bonded/coated triple lumen catheter was placed in the left femoral vein due to emergent situation using the Seldinger technique. Ultrasound guidance used.Yes.   Catheter placed to 20 cm. Blood aspirated via all 3 ports and then flushed x 3. Line sutured x 2 and dressing applied.  Evaluation Blood flow good Complications: No apparent complications Patient did tolerate procedure well.   Brett CanalesSteve Minor ACNP [facility] PCCM Pager [phone] till 3 pm If no answer page [phone] 08/26/2016, 11:16 AM

## 2016-08-18 NOTE — Progress Notes (Signed)
Visited with pt's friend who has been here  in waiting room since calling EMT's for pt today 0500. Friend has now sat with pt in room and understands her medical condition (per providers) will not change for at least 36 hrs. She thanked me for all my efforts, and was going home to get some sleep.   11-04-2015 1300  Clinical Encounter Type  Visited With Other (Comment)  Visit Type Follow-up (Friend who found pt unresponsive, called EMT)  Referral From Chaplain  Spiritual Encounters  Spiritual Needs Emotional  Stress Factors  Patient Stress Factors Health changes  Family Stress Factors Exhausted;Loss of control     11-04-2015 1300  Clinical Encounter Type  Visited With Other (Comment)  Visit Type Follow-up (Friend who found pt unresponsive, called EMT)  Referral From Chaplain  Spiritual Encounters  Spiritual Needs Emotional  Stress Factors  Patient Stress Factors Health changes  Family Stress Factors Exhausted;Loss of control

## 2016-08-18 NOTE — Progress Notes (Signed)
  Echocardiogram 2D Echocardiogram has been performed.  Barbara [name], Barbara [name] 09/01/2016, 9:37 AM

## 2016-08-18 NOTE — Progress Notes (Signed)
Stopped by to see if pt's mom was still there. She had left, and nurse informed me that she had shared the details of pt's poor prognosis with her. Chaplain available for follow-up.   08/08/2016 1700  Clinical Encounter Type  Visited With Patient  Visit Type Follow-up  Referral From Chaplain  Spiritual Encounters  Spiritual Needs Prayer  Stress Factors  Patient Stress Factors Health changes;Loss of control  Family Stress Factors Family relationships;Health changes;Loss of control;Other (Comment) (Doctor has told family that pt's prognosis is poor)

## 2016-08-18 NOTE — Progress Notes (Signed)
Pt insulin gtt rate increased to >19 units/hr. RN looked at IV insulin order set and changed multiplier to .01 from .06 as per ordered. Insulin rate order decreased from 19.3 to 6.
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
## 2016-08-18 NOTE — ED Triage Notes (Signed)
Patient arrived EMS from home witnessed arrest this morning , CPR initiated by EMS at approx. 0500 received 3 doses of Epinephrine for PEA , 3 shocks for VFib and D10% for CBG = 50 . Sinus tachycardia at arrival . 2 doses of Narcan 1 mg. prior to arrival .

## 2016-08-18 NOTE — Consult Note (Signed)
Urology Consult  Referring physician: Despina Hick Reason for referral: Retention  Chief Complaint: Retention  History of Present Illness: Cardiac arrest unknown cause; cannot get foley in; blood near meatus; asked to assess patient No further history from patient/family Modifying factors: There are no other modifying factors  Associated signs and symptoms: There are no other associated signs and symptoms Aggravating and relieving factors: There are no other aggravating or relieving factors Severity: Moderate Duration: Persistent  Past Medical History:  Diagnosis Date  . Insomnia   . Migraines    No past surgical history on file.  Medications: I have reviewed the patient's current medications. Allergies:  Allergies  Allergen Reactions  . Latex Rash    Family History  Problem Relation Age of Onset  . Family history unknown: Yes   Social History:  reports that she has never smoked. She does not have any smokeless tobacco history on file. She reports that she drinks alcohol. She reports that she does not use drugs.  ROS: All systems are reviewed and negative except as noted. Rest negative  Physical Exam:  Vital signs in last 24 hours: Temp:  [88.3 F (31.3 C)-91 F (32.8 C)] 91 F (32.8 C) (10/20 1200) Pulse Rate:  [37-102] 78 (10/20 1152) Resp:  [0-32] 32 (10/20 1152) BP: (81-151)/(33-74) 114/67 (10/20 1152) SpO2:  [87 %-100 %] 94 % (10/20 1152) FiO2 (%):  [60 %-100 %] 100 % (10/20 1152) Weight:  [50.3 kg (110 lb 14.3 oz)-56.7 kg (125 lb)] 50.3 kg (110 lb 14.3 oz) (10/20 4128)  Cardiovascular: Skin warm; not flushed Respiratory: Breaths quiet; no shortness of breath Abdomen: No masses Neurological: Normal sensation to touch Musculoskeletal: Normal motor function arms and legs Lymphatics: No inguinal adenopathy Skin: No rashes Genitourinary:? Yeast infection and peri-meatal swelling from ? Previous catheter attempts  Laboratory Data:  Results for orders placed or  performed during the hospital encounter of 08/26/2016 (from the past 72 hour(s))  CBG monitoring, ED     Status: Abnormal   Collection Time: 08/03/2016  6:16 AM  Result Value Ref Range   Glucose-Capillary 59 (L) 65 - 99 mg/dL   Comment 1 Notify RN    Comment 2 Document in Chart   CBC with Differential     Status: Abnormal   Collection Time: 08/05/2016  6:18 AM  Result Value Ref Range   WBC 26.3 (H) 4.0 - 10.5 K/uL    Comment: REPEATED TO VERIFY WHITE COUNT CONFIRMED ON SMEAR    RBC 3.96 3.87 - 5.11 MIL/uL   Hemoglobin 8.5 (L) 12.0 - 15.0 g/dL    Comment: REPEATED TO VERIFY   HCT 31.4 (L) 36.0 - 46.0 %   MCV 79.3 78.0 - 100.0 fL   MCH 21.5 (L) 26.0 - 34.0 pg   MCHC 27.1 (L) 30.0 - 36.0 g/dL   RDW 18.8 (H) 11.5 - 15.5 %   Platelets 165 150 - 400 K/uL    Comment: SPECIMEN CHECKED FOR CLOTS REPEATED TO VERIFY PLATELET COUNT CONFIRMED BY SMEAR    Neutrophils Relative % 88 %   Lymphocytes Relative 6 %   Monocytes Relative 6 %   Eosinophils Relative 0 %   Basophils Relative 0 %   Neutro Abs 23.1 (H) 1.7 - 7.7 K/uL   Lymphs Abs 1.6 0.7 - 4.0 K/uL   Monocytes Absolute 1.6 (H) 0.1 - 1.0 K/uL   Eosinophils Absolute 0.0 0.0 - 0.7 K/uL   Basophils Absolute 0.0 0.0 - 0.1 K/uL   RBC Morphology  ELLIPTOCYTES     Comment: BURR CELLS   WBC Morphology MILD LEFT SHIFT (1-5% METAS, OCC MYELO, OCC BANDS)   Comprehensive metabolic panel     Status: Abnormal   Collection Time: 08/20/2016  6:18 AM  Result Value Ref Range   Sodium 136 135 - 145 mmol/L   Potassium 5.9 (H) 3.5 - 5.1 mmol/L   Chloride 101 101 - 111 mmol/L   CO2 11 (L) 22 - 32 mmol/L   Glucose, Bld 287 (H) 65 - 99 mg/dL   BUN 26 (H) 6 - 20 mg/dL   Creatinine, Ser 4.39 (H) 0.44 - 1.00 mg/dL   Calcium 6.5 (L) 8.9 - 10.3 mg/dL   Total Protein 5.3 (L) 6.5 - 8.1 g/dL   Albumin 2.7 (L) 3.5 - 5.0 g/dL   AST 6,017 (H) 15 - 41 U/L    Comment: RESULTS CONFIRMED BY MANUAL DILUTION   ALT 1,983 (H) 14 - 54 U/L   Alkaline Phosphatase 66 38 - 126  U/L   Total Bilirubin 0.5 0.3 - 1.2 mg/dL   GFR calc non Af Amer 13 (L) >60 mL/min   GFR calc Af Amer 15 (L) >60 mL/min    Comment: (NOTE) The eGFR has been calculated using the CKD EPI equation. This calculation has not been validated in all clinical situations. eGFR's persistently <60 mL/min signify possible Chronic Kidney Disease.    Anion gap 24 (H) 5 - 15  Troponin I     Status: Abnormal   Collection Time: 08/03/2016  6:18 AM  Result Value Ref Range   Troponin I 3.27 (HH) <0.03 ng/mL    Comment: CRITICAL RESULT CALLED TO, READ BACK BY AND VERIFIED WITH: NICKY SIMMONS,RN AT 2119 07/31/2016 BY ZBEECH.   APTT     Status: Abnormal   Collection Time: 08/17/2016  6:18 AM  Result Value Ref Range   aPTT 65 (H) 24 - 36 seconds    Comment:        IF BASELINE aPTT IS ELEVATED, SUGGEST PATIENT RISK ASSESSMENT BE USED TO DETERMINE APPROPRIATE ANTICOAGULANT THERAPY.   Protime-INR     Status: Abnormal   Collection Time: 08/05/2016  6:18 AM  Result Value Ref Range   Prothrombin Time 41.3 (H) 11.4 - 15.2 seconds    Comment: REPEATED TO VERIFY   INR 4.16 (HH)     Comment: REPEATED TO VERIFY CRITICAL RESULT CALLED TO, READ BACK BY AND VERIFIED WITH: Maretta Bees RN 4174 08/05/2016 BY MACEDA, J   D-dimer, quantitative (not at [facility])     Status: Abnormal   Collection Time: 08/26/2016  6:18 AM  Result Value Ref Range   D-Dimer, Quant >20.00 (H) 0.00 - 0.50 ug/mL-FEU    Comment: SPECIMEN CHECKED FOR CLOTS REPEATED TO VERIFY (NOTE) At the manufacturer cut-off of 0.50 ug/mL FEU, this assay has been documented to exclude PE with a sensitivity and negative predictive value of 97 to 99%.  At this time, this assay has not been approved by the FDA to exclude DVT/VTE. Results should be correlated with clinical presentation.   Ethanol     Status: Abnormal   Collection Time: 08/05/2016  6:19 AM  Result Value Ref Range   Alcohol, Ethyl (B) 18 (H) <5 mg/dL    Comment:        LOWEST DETECTABLE LIMIT  FOR SERUM ALCOHOL IS 5 mg/dL FOR MEDICAL PURPOSES ONLY   Acetaminophen level     Status: Abnormal   Collection Time: 08/24/2016  6:19 AM  Result  Value Ref Range   Acetaminophen (Tylenol), Serum <10 (L) 10 - 30 ug/mL    Comment:        THERAPEUTIC CONCENTRATIONS VARY SIGNIFICANTLY. A RANGE OF 10-30 ug/mL MAY BE AN EFFECTIVE CONCENTRATION FOR MANY PATIENTS. HOWEVER, SOME ARE BEST TREATED AT CONCENTRATIONS OUTSIDE THIS RANGE. ACETAMINOPHEN CONCENTRATIONS >150 ug/mL AT 4 HOURS AFTER INGESTION AND >50 ug/mL AT 12 HOURS AFTER INGESTION ARE OFTEN ASSOCIATED WITH TOXIC REACTIONS.   Salicylate level     Status: None   Collection Time: 08/24/2016  6:19 AM  Result Value Ref Range   Salicylate Lvl <6.9 2.8 - 30.0 mg/dL  Type and screen     Status: None   Collection Time: 08/06/2016  6:30 AM  Result Value Ref Range   ABO/RH(D) O POS    Antibody Screen NEG    Sample Expiration 08/21/2016   ABO/Rh     Status: None   Collection Time: 08/10/2016  6:30 AM  Result Value Ref Range   ABO/RH(D) O POS   I-stat troponin, ED     Status: Abnormal   Collection Time: 08/09/2016  6:32 AM  Result Value Ref Range   Troponin i, poc 2.77 (HH) 0.00 - 0.08 ng/mL   Comment NOTIFIED PHYSICIAN    Comment 3            Comment: Due to the release kinetics of cTnI, a negative result within the first hours of the onset of symptoms does not rule out myocardial infarction with certainty. If myocardial infarction is still suspected, repeat the test at appropriate intervals.   I-Stat Beta hCG blood, ED ([facility], [facility], [facility] only)     Status: None   Collection Time: 08/11/2016  6:32 AM  Result Value Ref Range   I-stat hCG, quantitative <5.0 <5 mIU/mL   Comment 3            Comment:   GEST. AGE      CONC.  (mIU/mL)   <=1 WEEK        5 - 50     2 WEEKS       50 - 500     3 WEEKS       100 - 10,000     4 WEEKS     1,000 - 30,000        FEMALE AND NON-PREGNANT FEMALE:     LESS THAN 5 mIU/mL   CBG monitoring, ED     Status:  Abnormal   Collection Time: 07/31/2016  6:33 AM  Result Value Ref Range   Glucose-Capillary 230 (H) 65 - 99 mg/dL   Comment 1 Notify RN    Comment 2 Document in Chart   I-stat chem 8, ed     Status: Abnormal   Collection Time: 08/17/2016  6:34 AM  Result Value Ref Range   Sodium 134 (L) 135 - 145 mmol/L   Potassium 5.6 (H) 3.5 - 5.1 mmol/L   Chloride 100 (L) 101 - 111 mmol/L   BUN 39 (H) 6 - 20 mg/dL   Creatinine, Ser 4.40 (H) 0.44 - 1.00 mg/dL   Glucose, Bld 256 (H) 65 - 99 mg/dL   Calcium, Ion 0.78 (LL) 1.15 - 1.40 mmol/L   TCO2 12 0 - 100 mmol/L   Hemoglobin 10.9 (L) 12.0 - 15.0 g/dL   HCT 32.0 (L) 36.0 - 46.0 %  I-Stat CG4 Lactic Acid, ED     Status: Abnormal   Collection Time: 08/04/2016  6:34 AM  Result Value  Ref Range   Lactic Acid, Venous 16.42 (HH) 0.5 - 1.9 mmol/L   Comment NOTIFIED PHYSICIAN   I-Stat arterial blood gas, ED     Status: Abnormal   Collection Time: 08/14/2016  6:55 AM  Result Value Ref Range   pH, Arterial 6.819 (LL) 7.350 - 7.450   pCO2 arterial 45.6 32.0 - 48.0 mmHg   pO2, Arterial 72.0 (L) 83.0 - 108.0 mmHg   Bicarbonate 7.4 (L) 20.0 - 28.0 mmol/L   TCO2 9 0 - 100 mmol/L   O2 Saturation 75.0 %   Acid-base deficit 24.0 (H) 0.0 - 2.0 mmol/L   Patient temperature 98.6 F    Collection site RADIAL, ALLEN'S TEST ACCEPTABLE    Drawn by RT    Sample type ARTERIAL    Comment NOTIFIED PHYSICIAN   Glucose, capillary     Status: Abnormal   Collection Time: 08/25/2016  9:50 AM  Result Value Ref Range   Glucose-Capillary 349 (H) 65 - 99 mg/dL   Comment 1 Venous Specimen   Glucose, capillary     Status: Abnormal   Collection Time: 08/06/2016 10:30 AM  Result Value Ref Range   Glucose-Capillary 329 (H) 65 - 99 mg/dL   Comment 1 Arterial Specimen   Lactic acid, plasma     Status: Abnormal   Collection Time: 08/15/2016 10:47 AM  Result Value Ref Range   Lactic Acid, Venous 13.8 (HH) 0.5 - 1.9 mmol/L    Comment: RESULTS CONFIRMED BY MANUAL DILUTION CRITICAL RESULT  CALLED TO, READ BACK BY AND VERIFIED WITH: G.SANTONELLA,RN 1215 08/11/2016 CLARK,S   Troponin I     Status: Abnormal   Collection Time: 08/23/2016 10:48 AM  Result Value Ref Range   Troponin I 5.00 (HH) <0.03 ng/mL    Comment: CRITICAL VALUE NOTED.  VALUE IS CONSISTENT WITH PREVIOUSLY REPORTED AND CALLED VALUE.  Basic metabolic panel     Status: Abnormal   Collection Time: 08/29/2016 10:51 AM  Result Value Ref Range   Sodium 136 135 - 145 mmol/L   Potassium 6.1 (H) 3.5 - 5.1 mmol/L   Chloride 108 101 - 111 mmol/L   CO2 7 (L) 22 - 32 mmol/L   Glucose, Bld 345 (H) 65 - 99 mg/dL   BUN 23 (H) 6 - 20 mg/dL   Creatinine, Ser 4.06 (H) 0.44 - 1.00 mg/dL   Calcium 6.8 (L) 8.9 - 10.3 mg/dL   GFR calc non Af Amer 14 (L) >60 mL/min   GFR calc Af Amer 16 (L) >60 mL/min    Comment: (NOTE) The eGFR has been calculated using the CKD EPI equation. This calculation has not been validated in all clinical situations. eGFR's persistently <60 mL/min signify possible Chronic Kidney Disease.    Anion gap 21 (H) 5 - 15  CBC     Status: Abnormal   Collection Time: 08/02/2016 10:51 AM  Result Value Ref Range   WBC 18.8 (H) 4.0 - 10.5 K/uL    Comment: WHITE COUNT CONFIRMED ON SMEAR   RBC 3.56 (L) 3.87 - 5.11 MIL/uL   Hemoglobin 7.7 (L) 12.0 - 15.0 g/dL    Comment: REPEATED TO VERIFY   HCT 27.2 (L) 36.0 - 46.0 %   MCV 76.4 (L) 78.0 - 100.0 fL   MCH 21.6 (L) 26.0 - 34.0 pg   MCHC 28.3 (L) 30.0 - 36.0 g/dL   RDW 18.4 (H) 11.5 - 15.5 %   Platelets 152 150 - 400 K/uL    Comment: SPECIMEN CHECKED FOR  CLOTS REPEATED TO VERIFY PLATELET COUNT CONFIRMED BY SMEAR   Prepare fresh frozen plasma     Status: None (Preliminary result)   Collection Time: 08/07/2016 10:54 AM  Result Value Ref Range   Unit Number [id number]    Blood Component Type THAWED PLASMA    Unit division 00    Status of Unit ALLOCATED    Transfusion Status OK TO TRANSFUSE    Unit Number [id number]    Blood Component Type THAWED PLASMA     Unit division 00    Status of Unit ALLOCATED    Transfusion Status OK TO TRANSFUSE   Glucose, capillary     Status: Abnormal   Collection Time: 08/14/2016 12:01 PM  Result Value Ref Range   Glucose-Capillary 356 (H) 65 - 99 mg/dL   Comment 1 Venous Specimen    No results found for this or any previous visit (from the past 240 hour(s)). Creatinine:  Recent Labs  08/29/2016 0618 08/12/2016 0634 07/30/2016 1051  CREATININE 4.39* 4.40* 4.06*    Xrays: See report/chart none  Impression/Assessment:  Retention  Plan:  14 Fr Silastic catheter went in well; urine bit cloudy and sent for c/s Treat pos c/s  Give trial of voiding before d/c home pending recovery Irrigate catheter prn   Rickayla Wieland A 08/11/2016, 1:03 PM

## 2016-08-18 NOTE — Progress Notes (Signed)
eLink Physician-Brief Progress Note Patient Name: Barbara [name] DOB: 1986-09-27 MRN: [id number]   Date of Service  10-Nov-2015  HPI/Events of Note  Coagulopathy - INR = 7.41. 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 Suspect she is on Warfarin Rx. No medication history available.   eICU Interventions  Will order: 1. FFP - 4 units IV when available.  2. Repeat PT-INR at 9 PM.     Intervention Category Intermediate Interventions: Coagulopathy - evaluation and management  Treveon Bourcier Eugene 10-Nov-2015, 3:38 PM

## 2016-08-18 NOTE — Progress Notes (Signed)
Pharmacy Antibiotic Note  Barbara [name] is a 30 y.o. female admitted on 2016/09/20 afterPEA/ cardiac arrest. On hypothermia protocol.   Pharmacy has been consulted for Vancomycin and Zosyn dosing for sepsis coverage.  AKI, to begin CRRT.  Blood and respiratory cultures ordered.  WBC up to 26.3  Plan:  Antibiotics to begin after cultures obtained.  Vancomycin 1 gram IV x 1 then 500 mg IV q24hrs.  Zosyn 3.375 gm IV x 1 over 30 minutes, then 2.25 gm IV q6hrs.  Will follow renal function, CRRT duration, culture data, progress.  Pepcid 20 mg IV q12hrs adjusted to q24hrs for current renal function.  Height: 5\' 3"  (160 cm) Weight: 110 lb 14.3 oz (50.3 kg) IBW/kg (Calculated) : 52.4  Temp (24hrs), Avg:88.9 F (31.6 C), Min:88.3 F (31.3 C), Max:90.3 F (32.4 C)   Recent Labs Lab 06/25/2016 0618 06/25/2016 0634 06/25/2016 1051  WBC 26.3*  --  PENDING  CREATININE 4.39* 4.40*  --   LATICACIDVEN  --  16.42*  --     Estimated Creatinine Clearance: 15 mL/min (by C-G formula based on SCr of 4.4 mg/dL (H)).    Allergies  Allergen Reactions  . Latex Rash    Antimicrobials this admission:  Vancomycin 10/20>>  Zosyn 10/20>>  Dose adjustments this admission:  n/a  Microbiology results:  10/20 blood x 2 -  10/20 trach aspirate -  10/20 MRSA screen -  Thank you for allowing pharmacy to be a part of this patient's care.  Dennie Fettersgan, Ronit Cranfield Donovan, [place] Pager: [phone] 2016/09/20 11:31 AM

## 2016-08-19 DIAGNOSIS — I469 Cardiac arrest, cause unspecified: Principal | ICD-10-CM

## 2016-08-19 DIAGNOSIS — J80 Acute respiratory distress syndrome: Secondary | ICD-10-CM

## 2016-08-19 DIAGNOSIS — Q213 Tetralogy of Fallot: Secondary | ICD-10-CM

## 2016-08-19 DIAGNOSIS — J9600 Acute respiratory failure, unspecified whether with hypoxia or hypercapnia: Secondary | ICD-10-CM

## 2016-08-19 LAB — BASIC METABOLIC PANEL
ANION GAP: 19 — AB (ref 5–15)
Anion gap: 14 (ref 5–15)
Anion gap: 8 (ref 5–15)
Anion gap: 11 (ref 5–15)
BUN: 10 mg/dL (ref 6–20)
BUN: 8 mg/dL (ref 6–20)
BUN: 6 mg/dL (ref 6–20)
BUN: 8 mg/dL (ref 6–20)
CALCIUM: 6.7 mg/dL — AB (ref 8.9–10.3)
CALCIUM: 6.5 mg/dL — AB (ref 8.9–10.3)
CALCIUM: 6.9 mg/dL — AB (ref 8.9–10.3)
CHLORIDE: 99 mmol/L — AB (ref 101–111)
CO2: 21 mmol/L — AB (ref 22–32)
CO2: 23 mmol/L (ref 22–32)
CO2: 23 mmol/L (ref 22–32)
CO2: 24 mmol/L (ref 22–32)
CREATININE: 2.04 mg/dL — AB (ref 0.44–1.00)
CREATININE: 1.4 mg/dL — AB (ref 0.44–1.00)
Calcium: 6.8 mg/dL — ABNORMAL LOW (ref 8.9–10.3)
Chloride: 103 mmol/L (ref 101–111)
Chloride: 104 mmol/L (ref 101–111)
Chloride: 102 mmol/L (ref 101–111)
Creatinine, Ser: 1.59 mg/dL — ABNORMAL HIGH (ref 0.44–1.00)
Creatinine, Ser: 1.41 mg/dL — ABNORMAL HIGH (ref 0.44–1.00)
GFR calc Af Amer: 50 mL/min — ABNORMAL LOW (ref >60)
GFR calc Af Amer: 58 mL/min — ABNORMAL LOW (ref >60)
GFR calc Af Amer: 58 mL/min — ABNORMAL LOW (ref >60)
GFR calc non Af Amer: 32 mL/min — ABNORMAL LOW (ref >60)
GFR, EST AFRICAN AMERICAN: 37 mL/min — AB (ref >60)
GFR, EST NON AFRICAN AMERICAN: 43 mL/min — AB (ref >60)
GFR, EST NON AFRICAN AMERICAN: 50 mL/min — AB (ref >60)
GFR, EST NON AFRICAN AMERICAN: 50 mL/min — AB (ref >60)
GLUCOSE: 128 mg/dL — AB (ref 65–99)
GLUCOSE: 106 mg/dL — AB (ref 65–99)
GLUCOSE: 59 mg/dL — AB (ref 65–99)
Glucose, Bld: 139 mg/dL — ABNORMAL HIGH (ref 65–99)
POTASSIUM: 2.9 mmol/L — AB (ref 3.5–5.1)
Potassium: 4.2 mmol/L (ref 3.5–5.1)
Potassium: 6.5 mmol/L (ref 3.5–5.1)
Potassium: 4.3 mmol/L (ref 3.5–5.1)
Sodium: 139 mmol/L (ref 135–145)
Sodium: 140 mmol/L (ref 135–145)
Sodium: 135 mmol/L (ref 135–145)
Sodium: 137 mmol/L (ref 135–145)

## 2016-08-19 LAB — PREPARE FRESH FROZEN PLASMA
UNIT DIVISION: 0
UNIT DIVISION: 0
UNIT DIVISION: 0
Unit division: 0

## 2016-08-19 LAB — BLOOD GAS, ARTERIAL
ACID-BASE EXCESS: 1.2 mmol/L (ref 0.0–2.0)
BICARBONATE: 22.5 mmol/L (ref 20.0–28.0)
Drawn by: 225631
FIO2: 50
LHR: 32 {breaths}/min
MECHVT: 0.45 mL
O2 SAT: 100 %
PATIENT TEMPERATURE: 91.4
PCO2 ART: 16.5 mmHg — AB (ref 32.0–48.0)
PEEP/CPAP: 10 cmH2O
PH ART: 7.716 — AB (ref 7.350–7.450)
PO2 ART: 212 mmHg — AB (ref 83.0–108.0)

## 2016-08-19 LAB — URINE CULTURE: CULTURE: NO GROWTH

## 2016-08-19 LAB — RENAL FUNCTION PANEL
ALBUMIN: 2.5 g/dL — AB (ref 3.5–5.0)
ANION GAP: 10 (ref 5–15)
Albumin: 2.7 g/dL — ABNORMAL LOW (ref 3.5–5.0)
Anion gap: 18 — ABNORMAL HIGH (ref 5–15)
BUN: 9 mg/dL (ref 6–20)
BUN: 7 mg/dL (ref 6–20)
CHLORIDE: 99 mmol/L — AB (ref 101–111)
CHLORIDE: 104 mmol/L (ref 101–111)
CO2: 21 mmol/L — ABNORMAL LOW (ref 22–32)
CO2: 22 mmol/L (ref 22–32)
Calcium: 6.8 mg/dL — ABNORMAL LOW (ref 8.9–10.3)
Calcium: 6.6 mg/dL — ABNORMAL LOW (ref 8.9–10.3)
Creatinine, Ser: 2.01 mg/dL — ABNORMAL HIGH (ref 0.44–1.00)
Creatinine, Ser: 1.33 mg/dL — ABNORMAL HIGH (ref 0.44–1.00)
GFR calc non Af Amer: 53 mL/min — ABNORMAL LOW (ref >60)
GFR, EST AFRICAN AMERICAN: 38 mL/min — AB (ref >60)
GFR, EST NON AFRICAN AMERICAN: 32 mL/min — AB (ref >60)
GLUCOSE: 65 mg/dL (ref 65–99)
Glucose, Bld: 135 mg/dL — ABNORMAL HIGH (ref 65–99)
POTASSIUM: 2.9 mmol/L — AB (ref 3.5–5.1)
Phosphorus: 2.1 mg/dL — ABNORMAL LOW (ref 2.5–4.6)
Potassium: 5.4 mmol/L — ABNORMAL HIGH (ref 3.5–5.1)
Sodium: 138 mmol/L (ref 135–145)
Sodium: 136 mmol/L (ref 135–145)

## 2016-08-19 LAB — CBC
HCT: 30.7 % — ABNORMAL LOW (ref 36.0–46.0)
HEMATOCRIT: 20.2 % — AB (ref 36.0–46.0)
HEMOGLOBIN: 6.2 g/dL — AB (ref 12.0–15.0)
Hemoglobin: 9.9 g/dL — ABNORMAL LOW (ref 12.0–15.0)
MCH: 20.9 pg — ABNORMAL LOW (ref 26.0–34.0)
MCH: 23.3 pg — AB (ref 26.0–34.0)
MCHC: 30.7 g/dL (ref 30.0–36.0)
MCHC: 32.2 g/dL (ref 30.0–36.0)
MCV: 68.2 fL — ABNORMAL LOW (ref 78.0–100.0)
MCV: 72.4 fL — ABNORMAL LOW (ref 78.0–100.0)
PLATELETS: 58 10*3/uL — AB (ref 150–400)
Platelets: 71 10*3/uL — ABNORMAL LOW (ref 150–400)
RBC: 2.96 MIL/uL — AB (ref 3.87–5.11)
RBC: 4.24 MIL/uL (ref 3.87–5.11)
RDW: 17.6 % — ABNORMAL HIGH (ref 11.5–15.5)
RDW: 19.5 % — ABNORMAL HIGH (ref 11.5–15.5)
WBC: 3.3 10*3/uL — ABNORMAL LOW (ref 4.0–10.5)
WBC: 4.4 10*3/uL (ref 4.0–10.5)

## 2016-08-19 LAB — GLUCOSE, CAPILLARY
GLUCOSE-CAPILLARY: 66 mg/dL (ref 65–99)
GLUCOSE-CAPILLARY: 121 mg/dL — AB (ref 65–99)
GLUCOSE-CAPILLARY: 110 mg/dL — AB (ref 65–99)
GLUCOSE-CAPILLARY: 134 mg/dL — AB (ref 65–99)
Glucose-Capillary: 110 mg/dL — ABNORMAL HIGH (ref 65–99)
Glucose-Capillary: 127 mg/dL — ABNORMAL HIGH (ref 65–99)
Glucose-Capillary: 136 mg/dL — ABNORMAL HIGH (ref 65–99)
Glucose-Capillary: 138 mg/dL — ABNORMAL HIGH (ref 65–99)
Glucose-Capillary: 171 mg/dL — ABNORMAL HIGH (ref 65–99)
Glucose-Capillary: 174 mg/dL — ABNORMAL HIGH (ref 65–99)
Glucose-Capillary: 197 mg/dL — ABNORMAL HIGH (ref 65–99)
Glucose-Capillary: 220 mg/dL — ABNORMAL HIGH (ref 65–99)
Glucose-Capillary: 62 mg/dL — ABNORMAL LOW (ref 65–99)
Glucose-Capillary: 68 mg/dL (ref 65–99)
Glucose-Capillary: 85 mg/dL (ref 65–99)

## 2016-08-19 LAB — POCT I-STAT 3, ART BLOOD GAS (G3+)
Acid-base deficit: 2 mmol/L (ref 0.0–2.0)
Bicarbonate: 21.5 mmol/L (ref 20.0–28.0)
O2 SAT: 100 %
PCO2 ART: 28.5 mmHg — AB (ref 32.0–48.0)
Patient temperature: 35
TCO2: 22 mmol/L (ref 0–100)
pH, Arterial: 7.477 — ABNORMAL HIGH (ref 7.350–7.450)
pO2, Arterial: 186 mmHg — ABNORMAL HIGH (ref 83.0–108.0)

## 2016-08-19 LAB — PROTIME-INR
INR: 3.12
Prothrombin Time: 32.8 seconds — ABNORMAL HIGH (ref 11.4–15.2)

## 2016-08-19 LAB — TROPONIN I
TROPONIN I: 7.48 ng/mL — AB (ref <0.03)
TROPONIN I: 7.41 ng/mL — AB (ref <0.03)
Troponin I: 6.78 ng/mL (ref <0.03)

## 2016-08-19 LAB — DIC (DISSEMINATED INTRAVASCULAR COAGULATION)PANEL
INR: 3.51
Prothrombin Time: 36 seconds — ABNORMAL HIGH (ref 11.4–15.2)

## 2016-08-19 LAB — DIC (DISSEMINATED INTRAVASCULAR COAGULATION) PANEL
APTT: 47 s — AB (ref 24–36)
FIBRINOGEN: 128 mg/dL — AB (ref 210–475)
PLATELETS: 52 10*3/uL — AB (ref 150–400)
SMEAR REVIEW: NONE SEEN

## 2016-08-19 LAB — MAGNESIUM: MAGNESIUM: 1.7 mg/dL (ref 1.7–2.4)

## 2016-08-19 LAB — PREPARE RBC (CROSSMATCH)

## 2016-08-19 LAB — PHOSPHORUS: Phosphorus: <1 mg/dL — CL (ref 2.5–4.6)

## 2016-08-19 LAB — PROCALCITONIN: Procalcitonin: 55.34 ng/mL

## 2016-08-19 LAB — LACTIC ACID, PLASMA: Lactic Acid, Venous: 4.3 mmol/L (ref 0.5–1.9)

## 2016-08-19 MED ORDER — SODIUM CHLORIDE 0.9 % IV SOLN
Freq: Once | INTRAVENOUS | Status: AC
  Administered 2016-08-19: 17:00:00 via INTRAVENOUS

## 2016-08-19 MED ORDER — POTASSIUM PHOSPHATES 15 MMOLE/5ML IV SOLN
30.0000 mmol | Freq: Once | INTRAVENOUS | Status: AC
  Administered 2016-08-19: 30 mmol via INTRAVENOUS
  Filled 2016-08-19: qty 10

## 2016-08-19 MED ORDER — DEXTROSE 50 % IV SOLN
INTRAVENOUS | Status: AC
  Administered 2016-08-19: 07:00:00
  Filled 2016-08-19: qty 50

## 2016-08-19 MED ORDER — SODIUM CHLORIDE 0.9 % IV SOLN
Freq: Once | INTRAVENOUS | Status: AC
  Administered 2016-08-19: 05:00:00 via INTRAVENOUS

## 2016-08-19 MED ORDER — DEXTROSE 50 % IV SOLN
25.0000 mL | Freq: Once | INTRAVENOUS | Status: AC
  Administered 2016-08-19: 25 mL via INTRAVENOUS

## 2016-08-19 MED ORDER — PRISMASOL BGK 0/2.5 32-2.5 MEQ/L IV SOLN
INTRAVENOUS | Status: DC
  Administered 2016-08-19: 14:00:00 via INTRAVENOUS_CENTRAL
  Filled 2016-08-19 (×3): qty 5000

## 2016-08-19 MED ORDER — "THROMBI-PAD 3""X3"" EX PADS"
1.0000 | MEDICATED_PAD | Freq: Once | CUTANEOUS | Status: AC
  Administered 2016-08-19: 1 via TOPICAL
  Filled 2016-08-19: qty 1

## 2016-08-19 MED ORDER — INSULIN ASPART 100 UNIT/ML ~~LOC~~ SOLN
2.0000 [IU] | SUBCUTANEOUS | Status: DC
  Administered 2016-08-21 (×2): 2 [IU] via SUBCUTANEOUS

## 2016-08-19 MED ORDER — VITAMIN K1 10 MG/ML IJ SOLN
10.0000 mg | Freq: Once | INTRAMUSCULAR | Status: AC
  Administered 2016-08-19: 10 mg via SUBCUTANEOUS
  Filled 2016-08-19: qty 1

## 2016-08-19 MED ORDER — DEXTROSE 50 % IV SOLN
INTRAVENOUS | Status: AC
  Filled 2016-08-19: qty 50

## 2016-08-19 MED ORDER — FAMOTIDINE IN NACL 20-0.9 MG/50ML-% IV SOLN
20.0000 mg | Freq: Two times a day (BID) | INTRAVENOUS | Status: DC
  Administered 2016-08-19 – 2016-08-22 (×6): 20 mg via INTRAVENOUS
  Filled 2016-08-19 (×6): qty 50

## 2016-08-19 MED ORDER — PRISMASOL BGK 0/2.5 32-2.5 MEQ/L IV SOLN
INTRAVENOUS | Status: DC
  Administered 2016-08-19 – 2016-08-20 (×9): via INTRAVENOUS_CENTRAL
  Filled 2016-08-19 (×21): qty 5000

## 2016-08-19 MED ORDER — POTASSIUM CHLORIDE 10 MEQ/50ML IV SOLN
10.0000 meq | INTRAVENOUS | Status: AC
  Administered 2016-08-19 (×6): 10 meq via INTRAVENOUS
  Filled 2016-08-19 (×7): qty 50

## 2016-08-19 MED ORDER — DEXTROSE 10 % IV SOLN: INTRAVENOUS | Status: DC | PRN

## 2016-08-19 MED ORDER — INSULIN GLARGINE 100 UNIT/ML ~~LOC~~ SOLN
10.0000 [IU] | SUBCUTANEOUS | Status: DC
  Administered 2016-08-19: 10 [IU] via SUBCUTANEOUS
  Filled 2016-08-19 (×2): qty 0.1

## 2016-08-19 NOTE — Progress Notes (Signed)
Hypoglycemic Event  CBG: 68  Treatment: D50 IV 25 mL  Symptoms: None  Follow-up CBG: Time:1526 CBG Result:138  Possible Reasons for Event: Inadequate meal intake  Comments/MD notified:    Hae Ahlers K

## 2016-08-19 NOTE — Progress Notes (Signed)
CRITICAL VALUE ALERT  Critical value received:  Potassium 6.5  Date of notification:  08/19/16  Time of notification:  1220  Critical value read back:Yes.    Nurse who received alert:  Lexi   MD notified (1st page):  Dr. Hyman HopesWebb with Nephrology, CCM also aware   Time of first page:  1222  MD notified (2nd page):  Time of second page:  Responding MD:  Dr. Hyman HopesWebb  Time MD responded:  [phone]

## 2016-08-19 NOTE — Progress Notes (Signed)
[facility] ROUNDING NOTE   Subjective:   Interval History:  Sedated comfortable night   Evaluated by neurology - severe diffuse cerebral dysfunction that is non-specific in etiology and can be seen in the setting of anoxic/ischemic injury, toxic/metabolic encephalopathies, or medication effect. In the absence of CNS active, sedating, or anesthetic medications, this suggests a poor prognosis.  Objective:  Vital signs in last 24 hours:  Temp:  [90.5 F (32.5 C)-91.8 F (33.2 C)] 91.8 F (33.2 C) (10/21 1000) Pulse Rate:  [32-92] 77 (10/21 1000) Resp:  [0-32] 18 (10/21 1000) BP: (102-159)/(59-90) 123/71 (10/21 1000) SpO2:  [93 %-100 %] 99 % (10/21 1000) FiO2 (%):  [50 %-100 %] 50 % (10/21 0821) Weight:  [55.5 kg (122 lb 5.7 oz)] 55.5 kg (122 lb 5.7 oz) (10/21 0400)  Weight change: -6.4 kg (-14 lb 1.7 oz) Filed Weights   08/10/2016 0700 08/28/2016 0835 08/19/16 0400  Weight: 56.7 kg (125 lb) 50.3 kg (110 lb 14.3 oz) 55.5 kg (122 lb 5.7 oz)    Intake/Output: I/O last 3 completed shifts: In: 47829 [I.V.:10365; FAOZH:0865; IV HQIONGEXB:284] Out: 1324 [Urine:120; MWNUU:7253]   Intake/Output this shift:  Total I/O In: 514.6 [I.V.:109.6; IV Piggyback:405] Out: 424 [Other:424]  General:   Sedated and intubated ,  Well-developed, well-nourished Head:  Normocephalic and atraumatic. Eyes:  Sclera clear, no icterus.   Conjunctiva pink. Ears:  Normal auditory acuity. Nose:  No deformity, discharge,  or lesions. Mouth:  No deformity or lesions, dentition normal.  ET tube Neck:  Supple; no masses or thyromegaly. JVP not elevated Lungs:  Clear throughout to auscultation.   No wheezes, crackles, or rhonchi. No acute distress. Heart:  Regular rate and rhythm; Systolic murmur 3/6  Abdomen:   Distended and hypoactive bowel sounds  Msk:  Symmetrical without gross deformities. Normal posture. Pulses:  No carotid, renal, femoral bruits. DP and PT symmetrical and equal Extremities:   Without clubbing or edema.   Basic Metabolic Panel:  Recent Labs Lab 08/04/2016 1800  07/30/2016 2055 08/04/2016 2355 08/19/16 0125 08/19/16 0411 08/19/16 0412 08/19/16 0735  NA 136  < > 136 139 145 139 138 140  K 3.2*  < > 2.9* 3.3* 3.0* 2.9* 2.9* 4.2  CL 104  < > 95* 96* 116* 99* 99* 103  CO2 11*  --  17*  --  21* 21* 21* 23  GLUCOSE 404*  < > 326* 239* 102* 139* 135* 128*  BUN 24*  < > 18 14 14 10 9 8   CREATININE 3.55*  < > 2.98* 2.10* 1.78* 2.04* 2.01* 1.59*  CALCIUM 5.4*  --  6.1*  --  7.4* 6.8* 6.8* 6.7*  MG  --   --   --   --   --  1.7  --   --   PHOS 5.8*  --   --   --   --  <1.0* <1.0*  --   < > = values in this interval not displayed.  Liver Function Tests:  Recent Labs Lab 08/07/2016 0618 08/09/2016 1800 08/19/16 0125 08/19/16 0411 08/19/16 0412  AST 6,017*  --  34 8,813*  --   ALT 1,983*  --  13* 2,462*  --   ALKPHOS 66  --  60 58  --   BILITOT 0.5  --  1.1 1.6*  --   PROT 5.3*  --  5.8* 4.9*  --   ALBUMIN 2.7* 1.8* 2.0* 2.5* 2.5*   No results for input(s): LIPASE, AMYLASE  in the last 168 hours. No results for input(s): AMMONIA in the last 168 hours.  CBC:  Recent Labs Lab 08/24/2016 0618  08/12/2016 1051 08/29/2016 1355 08/06/2016 1552 08/08/2016 2016 08/12/2016 2355 08/19/16 0411  WBC 26.3*  --  18.8*  --   --   --   --  3.3*  NEUTROABS 23.1*  --   --   --   --   --   --   --   HGB 8.5*  < > 7.7* 7.8* 7.8* 10.2* 7.5* 6.2*  HCT 31.4*  < > 27.2* 23.0* 23.0* 30.0* 22.0* 20.2*  MCV 79.3  --  76.4*  --   --   --   --  68.2*  PLT 165  --  152  --   --   --   --  71*  < > = values in this interval not displayed.  Cardiac Enzymes:  Recent Labs Lab 08/03/2016 0618 08/12/2016 1013 08/06/2016 1048 08/29/2016 2055 08/19/16 0200 08/19/16 0735  CKTOTAL  --  7,026*  --   --   --   --   TROPONINI 3.27*  --  5.00* 6.18* 7.48* 7.41*    BNP: Invalid input(s): POCBNP  CBG:  Recent Labs Lab 08/19/16 0605 08/19/16 3785 08/19/16 0736 08/19/16 0844 08/19/16 0948   GLUCAP 66 171* 127* 121* 110*    Microbiology: Results for orders placed or performed during the hospital encounter of 08/14/2016  MRSA PCR Screening     Status: None   Collection Time: 07/31/2016 10:12 AM  Result Value Ref Range Status   MRSA by PCR NEGATIVE NEGATIVE Final    Comment:        The GeneXpert MRSA Assay (FDA approved for NASAL specimens only), is one component of a comprehensive MRSA colonization surveillance program. It is not intended to diagnose MRSA infection nor to guide or monitor treatment for MRSA infections.   Culture, respiratory (NON-Expectorated)     Status: None (Preliminary result)   Collection Time: 08/26/2016 11:57 AM  Result Value Ref Range Status   Specimen Description TRACHEAL ASPIRATE  Final   Special Requests Normal  Final   Gram Stain   Final    MODERATE WBC PRESENT, PREDOMINANTLY PMN FEW GRAM NEGATIVE COCCOBACILLI FEW GRAM NEGATIVE RODS RARE GRAM POSITIVE COCCI IN PAIRS AND CHAINS RARE GRAM NEGATIVE DIPLOCOCCI    Culture CULTURE REINCUBATED FOR BETTER GROWTH  Final   Report Status PENDING  Incomplete    Coagulation Studies:  Recent Labs  08/05/2016 0618 08/16/2016 1439 08/16/2016 2055 08/19/16 0411  LABPROT 41.3* 65.5* 33.5* 32.8*  INR 4.16* 7.41* 3.21 3.12    Urinalysis:  Recent Labs  08/27/2016 1309  COLORURINE YELLOW  LABSPEC 1.018  PHURINE 5.0  GLUCOSEU NEGATIVE  HGBUR LARGE*  BILIRUBINUR MODERATE*  KETONESUR 15*  PROTEINUR 100*  NITRITE POSITIVE*  LEUKOCYTESUR SMALL*      Imaging: Dg Chest Portable 1 View  Result Date: 08/19/2016 CLINICAL DATA:  30 year old female with a history of post cardiac arrest. Central line placement. EXAM: PORTABLE CHEST 1 VIEW COMPARISON:  08/17/2016 FINDINGS: Persistent cardiomegaly compared to the prior. Surgical changes of likely bilateral pulmonary artery stenting. Endotracheal tube suitably position above the carina, approximately 4.4 cm terminating at the clavicular heads. Interval  placement of left IJ central venous catheter which appears to terminate at the superior cavoatrial junction. Gastric tube projects over the mediastinum, terminating out of the field of view in the abdomen. Overlying EKG leads.  Defibrillator pads. Compare to  the prior there is low lung volumes with developing airspace interstitial opacities most pronounced at the bases. No pneumothorax.  Interlobular septal thickening. No displaced fracture. IMPRESSION: Compare to the prior plain film there are new airspace opacities at the lung bases. Given the lower lung volumes this may reflect atelectasis, however, additional considerations include developing pulmonary edema, pulmonary contusion, aspiration, and/or pneumonia. Finally, neurogenic edema could also be considered, as the patient has not yet had brain imaging. Unchanged endotracheal tube and gastric tube. Interval placement of left IJ central venous catheter which appears to terminate superior vena cava. Similar appearance of cardiomegaly. If there is concern for pericardial effusion in a patient of this age, recommend correlation with ECHO. Surgical changes of likely bilateral pulmonary artery stenting. Overlying EKG leads and defibrillator pads. Signed, Dulcy Fanny. Earleen Newport, DO [facility] Radiology Electronically Signed   By: Corrie Mckusick D.O.   On: 08/19/2016 08:32   Dg Chest Portable 1 View  Result Date: 08/28/2016 CLINICAL DATA:  30 year old female with intubation. EXAM: PORTABLE CHEST 1 VIEW COMPARISON:  None. FINDINGS: Endotracheal tube with tip approximately 4 cm above the carina. Enteric tube extends into the left hemi abdomen with tip beyond the inferior margin of the image. The lungs are clear. There is no pleural effusion or pneumothorax. There is moderate cardiomegaly. Bilateral pulmonary artery stents noted. No acute osseous pathology. IMPRESSION: Endotracheal tube above the carina. Cardiomegaly with  pulmonary vascular stents. No evidence of congestion or edema. Electronically Signed   By: Anner Crete M.D.   On: 07/31/2016 07:08     Medications:   . sodium chloride Stopped (08/19/16 0430)  . cisatracurium (NIMBEX) infusion 1.5 mcg/kg/min (08/19/16 0548)  . dextrose    . fentaNYL infusion INTRAVENOUS 200 mcg/hr (08/19/16 0800)  . insulin (NOVOLIN-R) infusion 1.1 mL/hr at 08/19/16 0950  . midazolam (VERSED) infusion 6 mg/hr (08/19/16 0950)  . norepinephrine (LEVOPHED) Adult infusion 7 mcg/min (08/19/16 0738)  . dialysis replacement fluid (prismasate) 200 mL/hr at 08/05/2016 2007  . dialysis replacement fluid (prismasate) 200 mL/hr at 08/23/2016 2007  . dialysate (PRISMASATE) 2,000 mL/hr at 08/19/16 0847   . artificial tears  1 application Both Eyes U6J  . chlorhexidine gluconate (MEDLINE KIT)  15 mL Mouth Rinse BID  . famotidine (PEPCID) IV  20 mg Intravenous Q24H  . insulin glargine  10 Units Subcutaneous Q24H  . mouth rinse  15 mL Mouth Rinse 10 times per day  . piperacillin-tazobactam (ZOSYN)  IV  2.25 g Intravenous Q6H  . potassium chloride  10 mEq Intravenous Q1 Hr x 6  . potassium phosphate IVPB (mmol)  30 mmol Intravenous Once  . sodium bicarbonate  50 mEq Intravenous Once  . vancomycin  500 mg Intravenous Q24H   [COMPLETED] cisatracurium **AND** cisatracurium (NIMBEX) infusion **AND** cisatracurium, dextrose, fentaNYL, heparin, midazolam, sodium chloride  Assessment/ Plan:  23F with insomnia, migraines,  Tetralogy of Fallot  admitted with cardiac arrest (PEA then VT/VF) with ROSC, acute encephalopathy, acute respiratory failure, and multiorgan failure with with acute renal failure with Cr of 4.39, liver failure with AST 6017 and ALT 1983, troponin of 3.27, hyperkalemia of 5.9, metabolic acidosis, severe lactic acidosis, anemia, autoanticoagulation with INR >4, leukocytosis, negative Tylenol and hCG levels, EtOH 18.  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

## 2016-08-19 NOTE — Progress Notes (Signed)
Hypoglycemic Event  CBG: 62  Treatment: D50 IV 25 mL  Symptoms: None  Follow-up CBG: Time:2003 CBG Result:134  Possible Reasons for Event: Unknown   Abbott PaoBailey, Ocean Kearley N
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
## 2016-08-19 NOTE — Progress Notes (Signed)
Critical Value ABG results called to Nelly LaurenceNatalie Snow, RN with Dr. Darrick Pennaeterding made aware at E-Link by RN with following changes to be made: (1) 450cc Vt decreased to 400cc, (2) set rate of 32 decreased to 18,  RT to monitor.

## 2016-08-19 NOTE — Progress Notes (Signed)
Lactic 4.3, CCM aware

## 2016-08-19 NOTE — Progress Notes (Addendum)
CRITICAL VALUE ALERT  Critical value received: Hemoglobin 6.2  Troponin 7.48 Phosphorus <1 pH pH 7.71 pCO2 17.75 Date of notification:  08/19/16  Time of notification:4:55 Critical value read back:Yes.    Nurse who received alert:  Nelly LaurenceNatalie Malya Cirillo  MD notified (1st page):  Dr. Darrick Pennaeterding

## 2016-08-19 NOTE — Progress Notes (Signed)
eLink Physician-Brief Progress Note Patient Name: Barbara [name] DOB: Sep 09, 1986 MRN: [id number]   Date of Service  08/19/2016  HPI/Events of Note  Hgb down to 6.2 from 7.5.  eICU Interventions  Plan: Transfuse 2 units of pRBC given cardiac issues. Post-transfusion CBC     Intervention Category Intermediate Interventions: Other:  Barbara [name] 08/19/2016, 4:33 AM

## 2016-08-19 NOTE — Progress Notes (Signed)
Patient Name: Barbara [name] Date of Encounter: 08/19/2016  Primary Cardiologist: new [facility])  Hospital Problem List     Active Problems:   Cardiac arrest [facility])   Acute renal failure ([place])   Hyperkalemia   Coagulopathy (HCC)   Shock liver   Acute encephalopathy   Lactic acidosis   Elevated troponin   Heart murmur   ARDS (adult respiratory distress syndrome) (HCC)   TOF (tetralogy of Fallot)     Subjective   Started on dialysis.  Getting blood transfusion.  Inpatient Medications    Scheduled Meds: . artificial tears  1 application Both Eyes Y8L  . chlorhexidine gluconate (MEDLINE KIT)  15 mL Mouth Rinse BID  . famotidine (PEPCID) IV  20 mg Intravenous Q24H  . insulin glargine  10 Units Subcutaneous Q24H  . mouth rinse  15 mL Mouth Rinse 10 times per day  . piperacillin-tazobactam (ZOSYN)  IV  2.25 g Intravenous Q6H  . potassium phosphate IVPB (mmol)  30 mmol Intravenous Once  . sodium bicarbonate  50 mEq Intravenous Once  . vancomycin  500 mg Intravenous Q24H   Continuous Infusions: . sodium chloride Stopped (08/19/16 0430)  . cisatracurium (NIMBEX) infusion 1.5 mcg/kg/min (08/19/16 0548)  . dextrose    . fentaNYL infusion INTRAVENOUS 200 mcg/hr (08/19/16 0800)  . insulin (NOVOLIN-R) infusion 1.1 mL/hr at 08/19/16 0950  . midazolam (VERSED) infusion 6 mg/hr (08/19/16 0950)  . norepinephrine (LEVOPHED) Adult infusion 7 mcg/min (08/19/16 0738)  . dialysis replacement fluid (prismasate) 200 mL/hr at 08/20/2016 2007  . dialysis replacement fluid (prismasate) 200 mL/hr at 08/11/2016 2007  . dialysate (PRISMASATE) 2,000 mL/hr at 08/19/16 0847   PRN Meds: [COMPLETED] cisatracurium **AND** cisatracurium (NIMBEX) infusion **AND** cisatracurium, dextrose, fentaNYL, heparin, midazolam, sodium chloride   Vital Signs    Vitals:   08/19/16 0821 08/19/16 0838 08/19/16 0900 08/19/16 1000  BP: 134/60 (!) 131/59 116/68 123/71  Pulse: 79 78 78 77  Resp: 18 18 18 18     Temp:  (!) 91.4 F (33 C) (!) 91 F (32.8 C) (!) 91.8 F (33.2 C)  TempSrc:  Core (Comment) Core (Comment) Core (Comment)  SpO2: 100% 98% 98% 99%  Weight:      Height:        Intake/Output Summary (Last 24 hours) at 08/19/16 1039 Last data filed at 08/19/16 1020  Gross per 24 hour  Intake          8637.18 ml  Output             5081 ml  Net          3556.18 ml   Filed Weights   07/30/2016 0700 08/14/2016 0835 08/19/16 0400  Weight: 56.7 kg (125 lb) 50.3 kg (110 lb 14.3 oz) 55.5 kg (122 lb 5.7 oz)    Physical Exam    GEN: intubated sedated  HEENT: Grossly normal.  Neck: Supple, no JVD, carotid bruits, or masses. Cardiac: RRR, 3/6 diastolic murmur, no edema.   Respiratory:  Coarse breath sounds to auscultation bilaterally. GI: Soft, nontender, nondistended, BS + x 4. MS: no deformity or atrophy. Skin: warm and dry, no rash. Neuro:  Intubated sedated Psych: intubated sedated  Labs    CBC  Recent Labs  07/30/2016 0618  08/25/2016 1051  08/04/2016 2355 08/19/16 0411  WBC 26.3*  --  18.8*  --   --  3.3*  NEUTROABS 23.1*  --   --   --   --   --   HGB  8.5*  < > 7.7*  < > 7.5* 6.2*  HCT 31.4*  < > 27.2*  < > 22.0* 20.2*  MCV 79.3  --  76.4*  --   --  68.2*  PLT 165  --  152  --   --  71*  < > = values in this interval not displayed. Basic Metabolic Panel  Recent Labs  08/19/16 0411 08/19/16 0412 08/19/16 0735  NA 139 138 140  K 2.9* 2.9* 4.2  CL 99* 99* 103  CO2 21* 21* 23  GLUCOSE 139* 135* 128*  BUN 10 9 8   CREATININE 2.04* 2.01* 1.59*  CALCIUM 6.8* 6.8* 6.7*  MG 1.7  --   --   PHOS <1.0* <1.0*  --    Liver Function Tests  Recent Labs  08/19/16 0125 08/19/16 0411 08/19/16 0412  AST 34 8,813*  --   ALT 13* 2,462*  --   ALKPHOS 60 58  --   BILITOT 1.1 1.6*  --   PROT 5.8* 4.9*  --   ALBUMIN 2.0* 2.5* 2.5*   No results for input(s): LIPASE, AMYLASE in the last 72 hours. Cardiac Enzymes  Recent Labs  08/27/2016 1013  07/31/2016 2055 08/19/16 0200  08/19/16 0735  CKTOTAL 9,422*  --   --   --   --   TROPONINI  --   < > 6.18* 7.48* 7.41*  < > = values in this interval not displayed. BNP Invalid input(s): POCBNP D-Dimer  Recent Labs  08/15/2016 0618  DDIMER >20.00*   Hemoglobin A1C No results for input(s): HGBA1C in the last 72 hours. Fasting Lipid Panel No results for input(s): CHOL, HDL, LDLCALC, TRIG, CHOLHDL, LDLDIRECT in the last 72 hours. Thyroid Function Tests No results for input(s): TSH, T4TOTAL, T3FREE, THYROIDAB in the last 72 hours.  Invalid input(s): FREET3  Telemetry    NSR- Personally Reviewed  ECG    NSR, RBBB Personally Reviewed  Radiology    Dg Chest Portable 1 View  Result Date: 08/24/2016 CLINICAL DATA:  30 year old female with a history of post cardiac arrest. Central line placement. EXAM: PORTABLE CHEST 1 VIEW COMPARISON:  08/25/2016 FINDINGS: Persistent cardiomegaly compared to the prior. Surgical changes of likely bilateral pulmonary artery stenting. Endotracheal tube suitably position above the carina, approximately 4.4 cm terminating at the clavicular heads. Interval placement of left IJ central venous catheter which appears to terminate at the superior cavoatrial junction. Gastric tube projects over the mediastinum, terminating out of the field of view in the abdomen. Overlying EKG leads.  Defibrillator pads. Compare to the prior there is low lung volumes with developing airspace interstitial opacities most pronounced at the bases. No pneumothorax.  Interlobular septal thickening. No displaced fracture. IMPRESSION: Compare to the prior plain film there are new airspace opacities at the lung bases. Given the lower lung volumes this may reflect atelectasis, however, additional considerations include developing pulmonary edema, pulmonary contusion, aspiration, and/or pneumonia. Finally, neurogenic edema could also be considered, as the patient has not yet had brain imaging. Unchanged endotracheal tube and  gastric tube. Interval placement of left IJ central venous catheter which appears to terminate superior vena cava. Similar appearance of cardiomegaly. If there is concern for pericardial effusion in a patient of this age, recommend correlation with ECHO. Surgical changes of likely bilateral pulmonary artery stenting. Overlying EKG leads and defibrillator pads. Signed, Dulcy Fanny. Earleen Newport, DO [facility] Radiology Electronically Signed   By: Corrie Mckusick D.O.   On:  08/05/2016 08:32   Dg Chest Portable 1 View  Result Date: 08/21/2016 CLINICAL DATA:  30 year old female with intubation. EXAM: PORTABLE CHEST 1 VIEW COMPARISON:  None. FINDINGS: Endotracheal tube with tip approximately 4 cm above the carina. Enteric tube extends into the left hemi abdomen with tip beyond the inferior margin of the image. The lungs are clear. There is no pleural effusion or pneumothorax. There is moderate cardiomegaly. Bilateral pulmonary artery stents noted. No acute osseous pathology. IMPRESSION: Endotracheal tube above the carina. Cardiomegaly with pulmonary vascular stents. No evidence of congestion or edema. Electronically Signed   By: Anner Crete M.D.   On: 08/27/2016 07:08    Cardiac Studies   Echo- EF 50% severe AI  Patient Profile     30 y/o with prior Tetralogy of Fallot repair  Assessment & Plan    1) Cardiac arrest.  Now being cooled.  Will see what is her neuro status when rewarmed.  This will decide further therapies. Supportive care for now.  Severe AI:  Manage hemodynamics now.  TEE suggested but would have to see what is her neuro status before considering. No evidence of Eisenmengers physiology on echo.  PA pressure 39 mm Hg.  ARF: likely related to arrest, ATN.      Signed, Larae Grooms, MD  08/19/2016, 10:39 AM

## 2016-08-19 NOTE — Progress Notes (Signed)
eLink Physician-Brief Progress Note Patient Name: Barbara [name] DOB: Oct 29, 1986 MRN: [id number]   Date of Service  08/19/2016  HPI/Events of Note  Respiratory alkalosis with pH 6.7 also with exogenous bicarb which was d/ced earlier.  Hypophosphatemia  eICU Interventions  Vent adjustments Phos replaced     Intervention Category Major Interventions: Acid-Base disturbance - evaluation and management Intermediate Interventions: Electrolyte abnormality - evaluation and management  Saben Donigan 08/19/2016, 5:05 AM

## 2016-08-19 NOTE — Progress Notes (Signed)
eLink Physician-Brief Progress Note Patient Name: Barbara ReaJenae [name] DOB: [birth year] MRN: [id number]   Date of Service  08/19/2016  HPI/Events of Note  Hypokalemia.  Also with oozing from CVL site  eICU Interventions  Plan: Potassium replaced Thrombi Pad to CVL site     Intervention Category Intermediate Interventions: Electrolyte abnormality - evaluation and management  Marlyne Totaro 08/19/2016, 2:48 AM

## 2016-08-20 ENCOUNTER — Inpatient Hospital Stay (HOSPITAL_COMMUNITY): Payer: Self-pay

## 2016-08-20 DIAGNOSIS — D689 Coagulation defect, unspecified: Secondary | ICD-10-CM

## 2016-08-20 LAB — COMPREHENSIVE METABOLIC PANEL
ALBUMIN: 2 g/dL — AB (ref 3.5–5.0)
ALK PHOS: 60 U/L (ref 38–126)
ALK PHOS: 51 U/L (ref 38–126)
ALT: 13 U/L — AB (ref 14–54)
ALT: 2289 U/L — ABNORMAL HIGH (ref 14–54)
ANION GAP: 8 (ref 5–15)
ANION GAP: 20 — AB (ref 5–15)
AST: 34 U/L (ref 15–41)
AST: 9048 U/L — ABNORMAL HIGH (ref 15–41)
Albumin: 2.5 g/dL — ABNORMAL LOW (ref 3.5–5.0)
BILIRUBIN TOTAL: 1.1 mg/dL (ref 0.3–1.2)
BILIRUBIN TOTAL: 1.5 mg/dL — AB (ref 0.3–1.2)
BUN: 14 mg/dL (ref 6–20)
BUN: 9 mg/dL (ref 6–20)
CALCIUM: 7.4 mg/dL — AB (ref 8.9–10.3)
CALCIUM: 6.9 mg/dL — AB (ref 8.9–10.3)
CO2: 21 mmol/L — AB (ref 22–32)
CO2: 21 mmol/L — ABNORMAL LOW (ref 22–32)
CREATININE: 1.78 mg/dL — AB (ref 0.44–1.00)
Chloride: 116 mmol/L — ABNORMAL HIGH (ref 101–111)
Chloride: 99 mmol/L — ABNORMAL LOW (ref 101–111)
Creatinine, Ser: 2.08 mg/dL — ABNORMAL HIGH (ref 0.44–1.00)
GFR calc Af Amer: 43 mL/min — ABNORMAL LOW (ref >60)
GFR calc Af Amer: 36 mL/min — ABNORMAL LOW (ref >60)
GFR calc non Af Amer: 38 mL/min — ABNORMAL LOW (ref >60)
GFR, EST NON AFRICAN AMERICAN: 31 mL/min — AB (ref >60)
GLUCOSE: 102 mg/dL — AB (ref 65–99)
Glucose, Bld: 133 mg/dL — ABNORMAL HIGH (ref 65–99)
POTASSIUM: 3 mmol/L — AB (ref 3.5–5.1)
Potassium: 3 mmol/L — ABNORMAL LOW (ref 3.5–5.1)
SODIUM: 145 mmol/L (ref 135–145)
Sodium: 140 mmol/L (ref 135–145)
TOTAL PROTEIN: 4.8 g/dL — AB (ref 6.5–8.1)
Total Protein: 5.8 g/dL — ABNORMAL LOW (ref 6.5–8.1)

## 2016-08-20 LAB — BASIC METABOLIC PANEL
ANION GAP: 12 (ref 5–15)
ANION GAP: 12 (ref 5–15)
BUN: 8 mg/dL (ref 6–20)
BUN: 9 mg/dL (ref 6–20)
CALCIUM: 7 mg/dL — AB (ref 8.9–10.3)
CHLORIDE: 101 mmol/L (ref 101–111)
CO2: 24 mmol/L (ref 22–32)
CO2: 24 mmol/L (ref 22–32)
CREATININE: 1.41 mg/dL — AB (ref 0.44–1.00)
Calcium: 7.1 mg/dL — ABNORMAL LOW (ref 8.9–10.3)
Chloride: 99 mmol/L — ABNORMAL LOW (ref 101–111)
Creatinine, Ser: 1.46 mg/dL — ABNORMAL HIGH (ref 0.44–1.00)
GFR calc Af Amer: 55 mL/min — ABNORMAL LOW (ref >60)
GFR calc non Af Amer: 48 mL/min — ABNORMAL LOW (ref >60)
GFR calc non Af Amer: 50 mL/min — ABNORMAL LOW (ref >60)
GFR, EST AFRICAN AMERICAN: 58 mL/min — AB (ref >60)
GLUCOSE: 64 mg/dL — AB (ref 65–99)
Glucose, Bld: 69 mg/dL (ref 65–99)
POTASSIUM: 3.8 mmol/L (ref 3.5–5.1)
Potassium: 3.9 mmol/L (ref 3.5–5.1)
Sodium: 135 mmol/L (ref 135–145)
Sodium: 137 mmol/L (ref 135–145)

## 2016-08-20 LAB — CULTURE, RESPIRATORY W GRAM STAIN: Special Requests: NORMAL

## 2016-08-20 LAB — TYPE AND SCREEN
ABO/RH(D): O POS
Antibody Screen: NEGATIVE
Unit division: 0
Unit division: 0

## 2016-08-20 LAB — LACTIC ACID, PLASMA: Lactic Acid, Venous: 4.3 mmol/L (ref 0.5–1.9)

## 2016-08-20 LAB — HEPATIC FUNCTION PANEL
ALBUMIN: 2.5 g/dL — AB (ref 3.5–5.0)
ALK PHOS: 58 U/L (ref 38–126)
ALT: 2462 U/L — AB (ref 14–54)
AST: 8813 U/L — ABNORMAL HIGH (ref 15–41)
BILIRUBIN INDIRECT: 1.1 mg/dL — AB (ref 0.3–0.9)
Bilirubin, Direct: 0.5 mg/dL (ref 0.1–0.5)
TOTAL PROTEIN: 4.9 g/dL — AB (ref 6.5–8.1)
Total Bilirubin: 1.6 mg/dL — ABNORMAL HIGH (ref 0.3–1.2)

## 2016-08-20 LAB — RENAL FUNCTION PANEL
ANION GAP: 10 (ref 5–15)
Albumin: 2.7 g/dL — ABNORMAL LOW (ref 3.5–5.0)
BUN: 9 mg/dL (ref 6–20)
CALCIUM: 7.3 mg/dL — AB (ref 8.9–10.3)
CO2: 25 mmol/L (ref 22–32)
Chloride: 103 mmol/L (ref 101–111)
Creatinine, Ser: 1.49 mg/dL — ABNORMAL HIGH (ref 0.44–1.00)
GFR calc Af Amer: 54 mL/min — ABNORMAL LOW (ref >60)
GFR calc non Af Amer: 47 mL/min — ABNORMAL LOW (ref >60)
GLUCOSE: 83 mg/dL (ref 65–99)
Phosphorus: 2.7 mg/dL (ref 2.5–4.6)
Potassium: 3.1 mmol/L — ABNORMAL LOW (ref 3.5–5.1)
SODIUM: 138 mmol/L (ref 135–145)

## 2016-08-20 LAB — CBC
HEMATOCRIT: 25.3 % — AB (ref 36.0–46.0)
Hemoglobin: 8.2 g/dL — ABNORMAL LOW (ref 12.0–15.0)
MCH: 23.4 pg — ABNORMAL LOW (ref 26.0–34.0)
MCHC: 32.4 g/dL (ref 30.0–36.0)
MCV: 72.1 fL — AB (ref 78.0–100.0)
Platelets: 48 10*3/uL — ABNORMAL LOW (ref 150–400)
RBC: 3.51 MIL/uL — AB (ref 3.87–5.11)
RDW: 19.7 % — ABNORMAL HIGH (ref 11.5–15.5)
WBC: 12.8 10*3/uL — AB (ref 4.0–10.5)

## 2016-08-20 LAB — GLUCOSE, CAPILLARY
GLUCOSE-CAPILLARY: 69 mg/dL (ref 65–99)
GLUCOSE-CAPILLARY: 101 mg/dL — AB (ref 65–99)
GLUCOSE-CAPILLARY: 99 mg/dL (ref 65–99)
Glucose-Capillary: 73 mg/dL (ref 65–99)
Glucose-Capillary: 124 mg/dL — ABNORMAL HIGH (ref 65–99)
Glucose-Capillary: 92 mg/dL (ref 65–99)
Glucose-Capillary: 70 mg/dL (ref 65–99)

## 2016-08-20 LAB — MAGNESIUM
MAGNESIUM: 2.2 mg/dL (ref 1.7–2.4)
Magnesium: 2.2 mg/dL (ref 1.7–2.4)

## 2016-08-20 LAB — PROCALCITONIN: Procalcitonin: 63.42 ng/mL

## 2016-08-20 LAB — AMMONIA: AMMONIA: 36 umol/L — AB (ref 9–35)

## 2016-08-20 LAB — PHOSPHORUS: Phosphorus: 3.2 mg/dL (ref 2.5–4.6)

## 2016-08-20 MED ORDER — DEXTROSE 50 % IV SOLN
INTRAVENOUS | Status: AC
  Filled 2016-08-20: qty 50

## 2016-08-20 MED ORDER — PRISMASOL BGK 4/2.5 32-4-2.5 MEQ/L IV SOLN
INTRAVENOUS | Status: DC
  Administered 2016-08-20 – 2016-08-22 (×15): via INTRAVENOUS_CENTRAL
  Filled 2016-08-20 (×23): qty 5000

## 2016-08-20 MED ORDER — SODIUM CHLORIDE 0.9% FLUSH
10.0000 mL | Freq: Two times a day (BID) | INTRAVENOUS | Status: DC
  Administered 2016-08-20: 20 mL
  Administered 2016-08-20: 10 mL
  Administered 2016-08-21: 40 mL
  Administered 2016-08-21 – 2016-08-22 (×2): 10 mL

## 2016-08-20 MED ORDER — SODIUM CHLORIDE 0.9% FLUSH: 10.0000 mL | INTRAVENOUS | Status: DC | PRN

## 2016-08-20 MED ORDER — PRISMASOL BGK 4/2.5 32-4-2.5 MEQ/L IV SOLN
INTRAVENOUS | Status: DC
  Administered 2016-08-20: 16:00:00 via INTRAVENOUS_CENTRAL
  Filled 2016-08-20 (×3): qty 5000

## 2016-08-20 MED ORDER — PRO-STAT SUGAR FREE PO LIQD
30.0000 mL | Freq: Two times a day (BID) | ORAL | Status: DC
  Administered 2016-08-20: 30 mL
  Filled 2016-08-20: qty 30

## 2016-08-20 MED ORDER — VITAMIN K1 10 MG/ML IJ SOLN
10.0000 mg | Freq: Once | INTRAMUSCULAR | Status: AC
  Administered 2016-08-20: 10 mg via SUBCUTANEOUS
  Filled 2016-08-20: qty 1

## 2016-08-20 MED ORDER — DEXTROSE 50 % IV SOLN
25.0000 mL | Freq: Once | INTRAVENOUS | Status: AC
  Administered 2016-08-20: 25 mL via INTRAVENOUS

## 2016-08-20 MED ORDER — VITAL HIGH PROTEIN PO LIQD
1000.0000 mL | ORAL | Status: DC
  Administered 2016-08-20: 1000 mL
  Administered 2016-08-20: 15:00:00

## 2016-08-20 MED ORDER — VITAL AF 1.2 CAL PO LIQD
1000.0000 mL | ORAL | Status: DC
  Administered 2016-08-20: 1000 mL

## 2016-08-20 MED ORDER — PRISMASOL BGK 4/2.5 32-4-2.5 MEQ/L IV SOLN
INTRAVENOUS | Status: DC
  Administered 2016-08-20: 16:00:00 via INTRAVENOUS_CENTRAL
  Filled 2016-08-20 (×5): qty 5000

## 2016-08-20 NOTE — Progress Notes (Signed)
[facility] ROUNDING NOTE   Subjective:   Interval History:  Comfortable night  -- still requiring levophed . Neurologically still no change severe diffuse cerebral dysfunction that is non-specific in etiology and can be seen in the setting of anoxic/ischemic injury, toxic/metabolic encephalopathies, or medication effect. In the absence of CNS active, sedating, or anesthetic medications, this suggests a poor prognosis.  Objective:  Vital signs in last 24 hours:  Temp:  [91 F (32.8 C)-97.9 F (36.6 C)] 97.3 F (36.3 C) (10/22 0800) Pulse Rate:  [39-125] 119 (10/22 0825) Resp:  [0-18] 18 (10/22 0825) BP: (82-138)/(49-71) 88/49 (10/22 0800) SpO2:  [88 %-100 %] 100 % (10/22 0825) FiO2 (%):  [40 %] 40 % (10/22 0825) Weight:  [54 kg (119 lb 0.8 oz)] 54 kg (119 lb 0.8 oz) (10/22 0500)  Weight change: 3.7 kg (8 lb 2.5 oz) Filed Weights   07/30/2016 0835 08/19/16 0400 08/20/16 0500  Weight: 50.3 kg (110 lb 14.3 oz) 55.5 kg (122 lb 5.7 oz) 54 kg (119 lb 0.8 oz)    Intake/Output: I/O last 3 completed shifts: In: 6495.9 [I.V.:3341.7; Blood:1534.2; IV Piggyback:1620] Out: 5082 [Urine:20; Other:5062]   Intake/Output this shift:  Total I/O In: 4.7 [I.V.:4.7] Out: 8 [Urine:5; Other:3]  General: Sedated and intubated , Well-developed, well-nourished Head: Normocephalic and atraumatic. Eyes: Sclera clear, no icterus. Conjunctiva pink. Ears: Normal auditory acuity. Nose: No deformity, discharge, or lesions. Mouth: No deformity or lesions, dentition normal. ET tube Neck: Supple; no masses or thyromegaly. JVP not elevated Lungs: Clear throughout to auscultation. No wheezes, crackles, or rhonchi. No acute distress. Heart: Regular rate and rhythm; Systolic murmur 3/6  Abdomen: Distended and hypoactive bowel sounds  Msk: Symmetrical without gross deformities. Normal posture. Pulses: No carotid, renal, femoral bruits. DP and PT symmetrical and  equal Extremities: Without clubbing or edema.   Basic Metabolic Panel:  Recent Labs Lab 08/13/2016 1800  08/19/16 0411 08/19/16 0412  08/19/16 1110 08/19/16 1505 08/19/16 2000 08/20/16 0001 08/20/16 0400  NA 136  < > 139 138  < > 135 136 137 137 135  K 3.2*  < > 2.9* 2.9*  < > 6.5* 5.4* 4.3 3.9 3.8  CL 104  < > 99* 99*  < > 104 104 102 101 99*  CO2 11*  < > 21* 21*  < > _0 GLUCOSE 404*  < > 139* 135*  < > 106* 65 59* 69 64*  BUN 24*  < > 10 9  < > _1 CREATININE 3.55*  < > 2.04* 2.01*  < > 1.40* 1.33* 1.41* 1.41* 1.46*  CALCIUM 5.4*  < > 6.8* 6.8*  < > 6.5* 6.6* 6.9* 7.1* 7.0*  MG  --   --  1.7  --   --   --   --   --   --  2.2  PHOS 5.8*  --  <1.0* <1.0*  --   --  2.1*  --   --  3.2  < > = values in this interval not displayed.  Liver Function Tests:  Recent Labs Lab 08/20/2016 0618 08/17/2016 1800 08/19/16 0125 08/19/16 0411 08/19/16 0412 08/19/16 1505  AST 6,017*  --  34 8,813*  --   --   ALT 1,983*  --  13* 2,462*  --   --   ALKPHOS 66  --  60 58  --   --   BILITOT 0.5  --  1.1 1.6*  --   --  PROT 5.3*  --  5.8* 4.9*  --   --   ALBUMIN 2.7* 1.8* 2.0* 2.5* 2.5* 2.7*   No results for input(s): LIPASE, AMYLASE in the last 168 hours. No results for input(s): AMMONIA in the last 168 hours.  CBC:  Recent Labs Lab 08/01/2016 0618  08/09/2016 1051  08/14/2016 2016 07/30/2016 2355 08/19/16 0411 08/19/16 1230 08/19/16 1610 08/20/16 0400  WBC 26.3*  --  18.8*  --   --   --  3.3* 4.4  --  12.8*  NEUTROABS 23.1*  --   --   --   --   --   --   --   --   --   HGB 8.5*  < > 7.7*  < > 10.2* 7.5* 6.2* 9.9*  --  8.2*  HCT 31.4*  < > 27.2*  < > 30.0* 22.0* 20.2* 30.7*  --  25.3*  MCV 79.3  --  76.4*  --   --   --  68.2* 72.4*  --  72.1*  PLT 165  --  152  --   --   --  71* 58* 52* 48*  < > = values in this interval not displayed.  Cardiac Enzymes:  Recent Labs Lab 08/09/2016 1013 08/19/2016 1048 08/19/2016 2055 08/19/16 0200 08/19/16 0735 08/19/16 1505   CKTOTAL 9,326*  --   --   --   --   --   TROPONINI  --  5.00* 6.18* 7.48* 7.41* 6.78*    BNP: Invalid input(s): POCBNP  CBG:  Recent Labs Lab 08/19/16 2003 08/20/16 0013 08/20/16 0414 08/20/16 0445 08/20/16 0710  GLUCAP 134* 73 69 124* 31    Microbiology: Results for orders placed or performed during the hospital encounter of 08/07/2016  MRSA PCR Screening     Status: None   Collection Time: 08/16/2016 10:12 AM  Result Value Ref Range Status   MRSA by PCR NEGATIVE NEGATIVE Final    Comment:        The GeneXpert MRSA Assay (FDA approved for NASAL specimens only), is one component of a comprehensive MRSA colonization surveillance program. It is not intended to diagnose MRSA infection nor to guide or monitor treatment for MRSA infections.   Culture, blood (Routine X 2) w Reflex to ID Panel     Status: None (Preliminary result)   Collection Time: 08/12/2016 11:55 AM  Result Value Ref Range Status   Specimen Description BLOOD CENTRAL LINE  Final   Special Requests BOTTLES DRAWN AEROBIC AND ANAEROBIC 10CC  Final   Culture NO GROWTH < 24 HOURS  Final   Report Status PENDING  Incomplete  Culture, respiratory (NON-Expectorated)     Status: None (Preliminary result)   Collection Time: 08/21/2016 11:57 AM  Result Value Ref Range Status   Specimen Description TRACHEAL ASPIRATE  Final   Special Requests Normal  Final   Gram Stain   Final    MODERATE WBC PRESENT, PREDOMINANTLY PMN FEW GRAM NEGATIVE COCCOBACILLI FEW GRAM NEGATIVE RODS RARE GRAM POSITIVE COCCI IN PAIRS AND CHAINS RARE GRAM NEGATIVE DIPLOCOCCI    Culture   Final    ABUNDANT HAEMOPHILUS INFLUENZAE BETA LACTAMASE POSITIVE    Report Status PENDING  Incomplete  Culture, blood (Routine X 2) w Reflex to ID Panel     Status: None (Preliminary result)   Collection Time: 08/25/2016 12:00 PM  Result Value Ref Range Status   Specimen Description BLOOD RIGHT HAND  Final   Special Requests IN PEDIATRIC BOTTLE [place]  Final    Culture NO GROWTH < 24 HOURS  Final   Report Status PENDING  Incomplete  Culture, Urine     Status: None   Collection Time: 07/30/2016  1:09 PM  Result Value Ref Range Status   Specimen Description URINE, CATHETERIZED  Final   Special Requests NONE  Final   Culture NO GROWTH  Final   Report Status 08/19/2016 FINAL  Final    Coagulation Studies:  Recent Labs  08/09/2016 0618 08/28/2016 1439 08/05/2016 2055 08/19/16 0411 08/19/16 1610  LABPROT 41.3* 65.5* 33.5* 32.8* 36.0*  INR 4.16* 7.41* 3.21 3.12 3.51    Urinalysis:  Recent Labs  08/24/2016 1309  COLORURINE YELLOW  LABSPEC 1.018  PHURINE 5.0  GLUCOSEU NEGATIVE  HGBUR LARGE*  BILIRUBINUR MODERATE*  KETONESUR 15*  PROTEINUR 100*  NITRITE POSITIVE*  LEUKOCYTESUR SMALL*      Imaging: No results found.   Medications:   . sodium chloride Stopped (08/19/16 0430)  . dextrose    . fentaNYL infusion INTRAVENOUS Stopped (08/19/16 2154)  . insulin (NOVOLIN-R) infusion Stopped (08/19/16 1250)  . midazolam (VERSED) infusion Stopped (08/19/16 2155)  . norepinephrine (LEVOPHED) Adult infusion 5 mcg/min (08/20/16 0606)  . dialysis replacement fluid (prismasate) 200 mL/hr at 08/19/16 1330  . dialysis replacement fluid (prismasate) 200 mL/hr at 08/19/16 1340  . dialysate (PRISMASATE) 2,000 mL/hr at 08/20/16 0720   . chlorhexidine gluconate (MEDLINE KIT)  15 mL Mouth Rinse BID  . famotidine (PEPCID) IV  20 mg Intravenous Q12H  . insulin aspart  2-6 Units Subcutaneous Q4H  . insulin glargine  10 Units Subcutaneous Q24H  . mouth rinse  15 mL Mouth Rinse 10 times per day  . piperacillin-tazobactam (ZOSYN)  IV  2.25 g Intravenous Q6H  . sodium bicarbonate  50 mEq Intravenous Once  . sodium chloride flush  10-40 mL Intracatheter Q12H  . vancomycin  500 mg Intravenous Q24H   dextrose, fentaNYL, heparin, midazolam, sodium chloride, sodium chloride flush  Assessment/ Plan:  71F with insomnia, migraines, Tetralogy of Fallot  admitted with cardiac arrest (PEA then VT/VF) with ROSC, acute encephalopathy, acute respiratory failure, and multiorgan failure with with acute renal failure with Cr of 4.39, liver failure with AST 6017 and ALT 1983, troponin of 3.27, hyperkalemia of 5.9, metabolic acidosis, severe lactic acidosis, anemia, autoanticoagulation with INR >4, leukocytosis, negative Tylenol and hCG levels, EtOH 18.  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

## 2016-08-20 NOTE — Progress Notes (Signed)
Visited briefly with pt's mom, who advised that medical team is beginning to warm ptr up. Offered emotional support.   08/20/16 0500  Clinical Encounter Type  Visited With Family  Visit Type Follow-up  Referral From Chaplain  Spiritual Encounters  Spiritual Needs Emotional  Stress Factors  Family Stress Factors Exhausted;Health changes;Loss of control

## 2016-08-20 NOTE — Progress Notes (Signed)
Brief Nutrition Note  Consult received for enteral/tube feeding initiation and management.  Adult Enteral Nutrition Protocol already initiated. TF orders adjusted to recommendations provided in initial assessment on 10/20. RD to follow-up 10/23.  Admitting Dx: Cardiac arrest (HCC) [I46.9]  Body mass index is 21.09 kg/m. Pt meets criteria for normal based on current BMI.  Labs:   Recent Labs Lab 08/19/16 0411 08/19/16 0412  08/19/16 1505 08/19/16 2000 08/20/16 0001 08/20/16 0400  NA 139 138  < > 136 137 137 135  K 2.9* 2.9*  < > 5.4* 4.3 3.9 3.8  CL 99* 99*  < > 104 102 101 99*  CO2 21* 21*  < > 22 24 24 24   BUN 10 9  < > 7 8 8 9   CREATININE 2.04* 2.01*  < > 1.33* 1.41* 1.41* 1.46*  CALCIUM 6.8* 6.8*  < > 6.6* 6.9* 7.1* 7.0*  MG 1.7  --   --   --   --   --  2.2  PHOS <1.0* <1.0*  --  2.1*  --   --  3.2  GLUCOSE 139* 135*  < > 65 59* 69 64*  < > = values in this interval not displayed.  Barbara FrancoLindsey Libia Fazzini, MS, RD, LDN Pager: [phone] After Hours Pager: [phone]
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
## 2016-08-20 NOTE — Progress Notes (Signed)
150 cc of 250 cc bag of fentanyl wasted in sink and 45 cc of 50 cc bag of versed wasted in sink both witnessed by two RNs Lexi Malvin JohnsPotter and Melonie Floridaraci Mikenzi Raysor.

## 2016-08-20 NOTE — Progress Notes (Signed)
Hypoglycemic Event  CBG: 69  Treatment: D50 IV 25 mL  Symptoms: None  Follow-up CBG: [id number] CBG Result:124  Possible Reasons for Event: Unknown      Abbott PaoBailey, Jaslynn Thome N

## 2016-08-20 NOTE — Progress Notes (Addendum)
Patient Name: Barbara [name] Date of Encounter: 08/20/2016  Primary Cardiologist: new [facility])  Hospital Problem List     Active Problems:   Cardiac arrest [facility])   Acute renal failure ([place])   Hyperkalemia   Coagulopathy (HCC)   Shock liver   Acute encephalopathy   Lactic acidosis   Elevated troponin   Heart murmur   ARDS (adult respiratory distress syndrome) (HCC)   TOF (tetralogy of Fallot)   Acute respiratory failure with hypoxia ([place])     Subjective   Started on dialysis.  Hbg increased with transfusion.  HR increased as well.  Rewarmed.  Requiring no sedation at this time.  Inpatient Medications    Scheduled Meds: . chlorhexidine gluconate (MEDLINE KIT)  15 mL Mouth Rinse BID  . famotidine (PEPCID) IV  20 mg Intravenous Q12H  . insulin aspart  2-6 Units Subcutaneous Q4H  . insulin glargine  10 Units Subcutaneous Q24H  . mouth rinse  15 mL Mouth Rinse 10 times per day  . piperacillin-tazobactam (ZOSYN)  IV  2.25 g Intravenous Q6H  . sodium bicarbonate  50 mEq Intravenous Once  . sodium chloride flush  10-40 mL Intracatheter Q12H  . vancomycin  500 mg Intravenous Q24H   Continuous Infusions: . sodium chloride Stopped (08/19/16 0430)  . dextrose    . fentaNYL infusion INTRAVENOUS Stopped (08/19/16 2154)  . insulin (NOVOLIN-R) infusion Stopped (08/19/16 1250)  . midazolam (VERSED) infusion Stopped (08/19/16 2155)  . norepinephrine (LEVOPHED) Adult infusion 5 mcg/min (08/20/16 0800)  . dialysis replacement fluid (prismasate) 200 mL/hr at 08/19/16 1330  . dialysis replacement fluid (prismasate) 200 mL/hr at 08/19/16 1340  . dialysate (PRISMASATE) 2,000 mL/hr at 08/20/16 0720   PRN Meds: dextrose, fentaNYL, heparin, midazolam, sodium chloride, sodium chloride flush   Vital Signs    Vitals:   08/20/16 0700 08/20/16 0800 08/20/16 0825 08/20/16 0900  BP:  (!) 88/49    Pulse: (!) 117 (!) 117 (!) 119 (!) 119  Resp: 17 18 18 18   Temp: 97.7 F (36.5 C) 97.3  F (36.3 C)  97.7 F (36.5 C)  TempSrc: Core (Comment) Core (Comment)  Core (Comment)  SpO2: 100% 100% 100% 100%  Weight:      Height:        Intake/Output Summary (Last 24 hours) at 08/20/16 0920 Last data filed at 08/20/16 0901  Gross per 24 hour  Intake          1907.51 ml  Output             1368 ml  Net           539.51 ml   Filed Weights   08/26/2016 0835 08/19/16 0400 08/20/16 0500  Weight: 50.3 kg (110 lb 14.3 oz) 55.5 kg (122 lb 5.7 oz) 54 kg (119 lb 0.8 oz)    Physical Exam    GEN: intubated sedated  HEENT: Grossly normal.  Neck: Supple, no JVD, carotid bruits, or masses. Cardiac: tachycardic, 3/6 harsh murmur, no edema.  Murmur sounds systolic today.  Respiratory:  Coarse breath sounds to auscultation bilaterally. GI: Soft, nontender, nondistended, BS + x 4. MS: no deformity or atrophy. Skin: warm and dry, no rash. Neuro:  Intubated sedated Psych: intubated sedated  Labs    CBC  Recent Labs  08/25/2016 0618  08/19/16 1230 08/19/16 1610 08/20/16 0400  WBC 26.3*  < > 4.4  --  12.8*  NEUTROABS 23.1*  --   --   --   --  HGB 8.5*  < > 9.9*  --  8.2*  HCT 31.4*  < > 30.7*  --  25.3*  MCV 79.3  < > 72.4*  --  72.1*  PLT 165  < > 58* 52* 48*  < > = values in this interval not displayed. Basic Metabolic Panel  Recent Labs  08/19/16 0411  08/19/16 1505  08/20/16 0001 08/20/16 0400  NA 139  < > 136  < > 137 135  K 2.9*  < > 5.4*  < > 3.9 3.8  CL 99*  < > 104  < > 101 99*  CO2 21*  < > 22  < > 24 24  GLUCOSE 139*  < > 65  < > 69 64*  BUN 10  < > 7  < > 8 9  CREATININE 2.04*  < > 1.33*  < > 1.41* 1.46*  CALCIUM 6.8*  < > 6.6*  < > 7.1* 7.0*  MG 1.7  --   --   --   --  2.2  PHOS <1.0*  < > 2.1*  --   --  3.2  < > = values in this interval not displayed. Liver Function Tests  Recent Labs  08/19/16 0125 08/19/16 0411 08/19/16 0412 08/19/16 1505  AST 34 8,813*  --   --   ALT 13* 2,462*  --   --   ALKPHOS 60 58  --   --   BILITOT 1.1 1.6*  --    --   PROT 5.8* 4.9*  --   --   ALBUMIN 2.0* 2.5* 2.5* 2.7*   No results for input(s): LIPASE, AMYLASE in the last 72 hours. Cardiac Enzymes  Recent Labs  08/15/2016 1013  08/19/16 0200 08/19/16 0735 08/19/16 1505  CKTOTAL 9,422*  --   --   --   --   TROPONINI  --   < > 7.48* 7.41* 6.78*  < > = values in this interval not displayed. BNP Invalid input(s): POCBNP D-Dimer  Recent Labs  08/24/2016 0618 08/19/16 1610  DDIMER >20.00* >20.00*   Hemoglobin A1C No results for input(s): HGBA1C in the last 72 hours. Fasting Lipid Panel No results for input(s): CHOL, HDL, LDLCALC, TRIG, CHOLHDL, LDLDIRECT in the last 72 hours. Thyroid Function Tests No results for input(s): TSH, T4TOTAL, T3FREE, THYROIDAB in the last 72 hours.  Invalid input(s): FREET3  Telemetry    Sinus tach- Personally Reviewed  ECG    NSR, RBBB Personally Reviewed  Radiology    No results found.  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  Neuro assessment pending.  She did not withdraw to painful stimuli of fingernail pressure.  She slightly opened her eyes.   Supportive care for now.  Severe AI:  Manage hemodynamics now.  TEE suggested but would have to see what is her neuro status before considering. No evidence of Eisenmengers physiology on echo.  PA pressure 39 mm Hg.  ARF: likely related to arrest, ATN.  Spoke to Dr. Chase Caller yesterday about reversing coagulopathy.  No mechanical valves in place.  OK to reverse.    Signed, Larae Grooms, MD  08/20/2016, 9:20 AM

## 2016-08-21 ENCOUNTER — Inpatient Hospital Stay (HOSPITAL_COMMUNITY): Payer: Self-pay

## 2016-08-21 DIAGNOSIS — J9600 Acute respiratory failure, unspecified whether with hypoxia or hypercapnia: Secondary | ICD-10-CM

## 2016-08-21 DIAGNOSIS — K7200 Acute and subacute hepatic failure without coma: Secondary | ICD-10-CM

## 2016-08-21 DIAGNOSIS — I351 Nonrheumatic aortic (valve) insufficiency: Secondary | ICD-10-CM

## 2016-08-21 DIAGNOSIS — N170 Acute kidney failure with tubular necrosis: Secondary | ICD-10-CM

## 2016-08-21 DIAGNOSIS — L8990 Pressure ulcer of unspecified site, unspecified stage: Secondary | ICD-10-CM | POA: Insufficient documentation

## 2016-08-21 LAB — PREPARE FRESH FROZEN PLASMA
Unit division: 0
Unit division: 0

## 2016-08-21 LAB — RENAL FUNCTION PANEL
ALBUMIN: 2.8 g/dL — AB (ref 3.5–5.0)
ANION GAP: 7 (ref 5–15)
BUN: 14 mg/dL (ref 6–20)
CHLORIDE: 104 mmol/L (ref 101–111)
CO2: 25 mmol/L (ref 22–32)
Calcium: 7.4 mg/dL — ABNORMAL LOW (ref 8.9–10.3)
Creatinine, Ser: 1.48 mg/dL — ABNORMAL HIGH (ref 0.44–1.00)
GFR calc Af Amer: 54 mL/min — ABNORMAL LOW (ref >60)
GFR, EST NON AFRICAN AMERICAN: 47 mL/min — AB (ref >60)
Glucose, Bld: 98 mg/dL (ref 65–99)
PHOSPHORUS: 2.2 mg/dL — AB (ref 2.5–4.6)
POTASSIUM: 4.2 mmol/L (ref 3.5–5.1)
Sodium: 136 mmol/L (ref 135–145)

## 2016-08-21 LAB — GLUCOSE, CAPILLARY
GLUCOSE-CAPILLARY: 122 mg/dL — AB (ref 65–99)
GLUCOSE-CAPILLARY: 134 mg/dL — AB (ref 65–99)
GLUCOSE-CAPILLARY: 79 mg/dL (ref 65–99)
Glucose-Capillary: 104 mg/dL — ABNORMAL HIGH (ref 65–99)
Glucose-Capillary: 122 mg/dL — ABNORMAL HIGH (ref 65–99)
Glucose-Capillary: 70 mg/dL (ref 65–99)
Glucose-Capillary: 92 mg/dL (ref 65–99)

## 2016-08-21 LAB — BASIC METABOLIC PANEL
ANION GAP: 7 (ref 5–15)
BUN: 11 mg/dL (ref 6–20)
CALCIUM: 7.5 mg/dL — AB (ref 8.9–10.3)
CHLORIDE: 105 mmol/L (ref 101–111)
CO2: 27 mmol/L (ref 22–32)
CREATININE: 1.43 mg/dL — AB (ref 0.44–1.00)
GFR calc non Af Amer: 49 mL/min — ABNORMAL LOW (ref >60)
GFR, EST AFRICAN AMERICAN: 57 mL/min — AB (ref >60)
Glucose, Bld: 117 mg/dL — ABNORMAL HIGH (ref 65–99)
Potassium: 3.4 mmol/L — ABNORMAL LOW (ref 3.5–5.1)
SODIUM: 139 mmol/L (ref 135–145)

## 2016-08-21 LAB — CBC
HEMATOCRIT: 25 % — AB (ref 36.0–46.0)
Hemoglobin: 8 g/dL — ABNORMAL LOW (ref 12.0–15.0)
MCH: 23.3 pg — ABNORMAL LOW (ref 26.0–34.0)
MCHC: 32 g/dL (ref 30.0–36.0)
MCV: 72.9 fL — ABNORMAL LOW (ref 78.0–100.0)
PLATELETS: 44 10*3/uL — AB (ref 150–400)
RBC: 3.43 MIL/uL — AB (ref 3.87–5.11)
RDW: 20.4 % — AB (ref 11.5–15.5)
WBC: 15.8 10*3/uL — AB (ref 4.0–10.5)

## 2016-08-21 LAB — HEPATIC FUNCTION PANEL
ALBUMIN: 2.8 g/dL — AB (ref 3.5–5.0)
ALT: 1634 U/L — ABNORMAL HIGH (ref 14–54)
AST: 1740 U/L — AB (ref 15–41)
Alkaline Phosphatase: 94 U/L (ref 38–126)
Bilirubin, Direct: 2.1 mg/dL — ABNORMAL HIGH (ref 0.1–0.5)
Indirect Bilirubin: 1.8 mg/dL — ABNORMAL HIGH (ref 0.3–0.9)
TOTAL PROTEIN: 5.3 g/dL — AB (ref 6.5–8.1)
Total Bilirubin: 3.9 mg/dL — ABNORMAL HIGH (ref 0.3–1.2)

## 2016-08-21 LAB — PHOSPHORUS: Phosphorus: 1.2 mg/dL — ABNORMAL LOW (ref 2.5–4.6)

## 2016-08-21 LAB — MAGNESIUM
MAGNESIUM: 2.5 mg/dL — AB (ref 1.7–2.4)
MAGNESIUM: 2.2 mg/dL (ref 1.7–2.4)

## 2016-08-21 LAB — PROTIME-INR
INR: 1.91
PROTHROMBIN TIME: 22.2 s — AB (ref 11.4–15.2)

## 2016-08-21 MED ORDER — POTASSIUM PHOSPHATES 15 MMOLE/5ML IV SOLN
30.0000 mmol | Freq: Once | INTRAVENOUS | Status: AC
  Administered 2016-08-21: 30 mmol via INTRAVENOUS
  Filled 2016-08-21: qty 10

## 2016-08-21 MED ORDER — METOPROLOL TARTRATE 5 MG/5ML IV SOLN
2.5000 mg | Freq: Four times a day (QID) | INTRAVENOUS | Status: DC
  Administered 2016-08-21 – 2016-08-22 (×3): 2.5 mg via INTRAVENOUS
  Filled 2016-08-21 (×3): qty 5

## 2016-08-21 MED ORDER — SODIUM CHLORIDE 0.9 % IV SOLN
3.0000 g | Freq: Three times a day (TID) | INTRAVENOUS | Status: DC
  Administered 2016-08-21 – 2016-08-22 (×3): 3 g via INTRAVENOUS
  Filled 2016-08-21 (×6): qty 3

## 2016-08-21 MED ORDER — VITAL AF 1.2 CAL PO LIQD
1000.0000 mL | ORAL | Status: DC
  Administered 2016-08-21: 1000 mL

## 2016-08-21 MED ORDER — PRO-STAT SUGAR FREE PO LIQD
30.0000 mL | Freq: Three times a day (TID) | ORAL | Status: DC
  Administered 2016-08-21 – 2016-08-22 (×3): 30 mL
  Filled 2016-08-21 (×3): qty 30

## 2016-08-21 NOTE — Procedures (Signed)
ELECTROENCEPHALOGRAM REPORT  Date of Study: 08/21/16  Patient's Name: Barbara [name] MRN: [id number] Date of Birth: 07-03-1986  Clinical History: 30 y.o. with hx of CP arrest, now off of sedation with continued MS change   Technical Summary: A multichannel digital EEG recording measured by the international 10-20 system with electrodes applied with paste and impedances below 5000 ohms performed in our laboratory with EKG monitoring in an intubated and non-sedated patient.  Hyperventilation and photic stimulation were not performed.  The digital EEG was referentially recorded, reformatted, and digitally filtered in a variety of bipolar and referential montages for optimal display.    Description: The patient is intubated and not sedated during the recording. There is loss of normal background activity. The records read at a sensitivity of 3 uV/mm shows diffuse suppression of background activity. There is no spontaneous reactivity or reactivity noted with noxious stimulation. There is muscle artifact over the frontal leads. Hyperventilation and photic stimulation were not performed. There were no epileptiform discharges or electrographic seizures seen.   EKG lead was unremarkable.  Impression: This EEG is markedly abnormal due to diffuse background suppression and lack of EEG reactivity with noxious stimulation.  Clinical Correlation of the above findings indicates severe diffuse cerebral dysfunction that is non-specific in etiology and can be seen in the setting of anoxic/ischemic injury, toxic/metabolic encephalopathies, or medication effect. In the absence of CNS active, sedating, or anesthetic medications, this suggests a poor prognosis. Clinical correlation is advised.

## 2016-08-21 NOTE — Progress Notes (Signed)
Patient Name: Barbara [name] Date of Encounter: 08/21/2016  Active Problems:   Cardiac arrest ([place])   Acute renal failure (HCC)   Hyperkalemia   Coagulopathy (HCC)   Shock liver   Acute encephalopathy   Lactic acidosis   Elevated troponin   Heart murmur   ARDS (adult respiratory distress syndrome) (HCC)   TOF (tetralogy of Fallot)   Acute respiratory failure with hypoxia (HCC)   Pressure injury of skin   Length of Stay: 3  SUBJECTIVE  On hemodialysis.  Hbg increased with transfusion.  HR increased as well.  Rewarmed.  Requiring no sedation at this time. Off levophed since 7:30 am.  CURRENT MEDS . chlorhexidine gluconate (MEDLINE KIT)  15 mL Mouth Rinse BID  . famotidine (PEPCID) IV  20 mg Intravenous Q12H  . insulin aspart  2-6 Units Subcutaneous Q4H  . mouth rinse  15 mL Mouth Rinse 10 times per day  . piperacillin-tazobactam (ZOSYN)  IV  2.25 g Intravenous Q6H  . sodium bicarbonate  50 mEq Intravenous Once  . sodium chloride flush  10-40 mL Intracatheter Q12H  . vancomycin  500 mg Intravenous Q24H   . feeding supplement (VITAL AF 1.2 CAL) 1,000 mL (08/20/16 1500)  . norepinephrine (LEVOPHED) Adult infusion Stopped (08/21/16 0736)  . dialysis replacement fluid (prismasate) 200 mL/hr at 08/20/16 1545  . dialysis replacement fluid (prismasate) 200 mL/hr at 08/20/16 1545  . dialysate (PRISMASATE) 2,000 mL/hr at 08/21/16 0728   OBJECTIVE  Vitals:   08/21/16 0724 08/21/16 0737 08/21/16 0800 08/21/16 0803  BP:      Pulse: (!) 101 (!) 103 (!) 102   Resp: _0 Temp:   98.2 F (36.8 C)   TempSrc:   Core (Comment)   SpO2: 100% 100% 100% 100%  Weight:      Height:        Intake/Output Summary (Last 24 hours) at 08/21/16 0901 Last data filed at 08/21/16 0800  Gross per 24 hour  Intake           1441.4 ml  Output             1450 ml  Net             -8.6 ml   Filed Weights   08/19/16 0400 08/20/16 0500 08/21/16 0417  Weight: 122 lb 5.7 oz (55.5 kg) 119  lb 0.8 oz (54 kg) 125 lb 7.1 oz (56.9 kg)   PHYSICAL EXAM  General: intubated, sedated, opens eyes Neck: Supple without bruits or JVD. Lungs:  Resp regular and unlabored, crackles B/L Heart: RRR no s3, s4, 3/6 diastolic murmur. Abdomen: Soft, non-tender, non-distended, BS + x 4.  Extremities: No clubbing, cyanosis or edema. DP/PT/Radials 2+ and equal bilaterally.  Accessory Clinical Findings  CBC  Recent Labs  08/20/16 0400 08/21/16 0450  WBC 12.8* 15.8*  HGB 8.2* 8.0*  HCT 25.3* 25.0*  MCV 72.1* 72.9*  PLT 48* 44*   Basic Metabolic Panel  Recent Labs  08/20/16 1400 08/21/16 0450  NA 138 139  K 3.1* 3.4*  CL 103 105  CO2 25 27  GLUCOSE 83 117*  BUN 9 11  CREATININE 1.49* 1.43*  CALCIUM 7.3* 7.5*  MG 2.2 2.5*  PHOS 2.7 1.2*   Liver Function Tests  Recent Labs  08/19/16 0411  08/20/16 1400 08/21/16 0450  AST 8,813*  --   --  1,740*  ALT 2,462*  --   --  1,634*  ALKPHOS 58  --   --  94  BILITOT 1.6*  --   --  3.9*  PROT 4.9*  --   --  5.3*  ALBUMIN 2.5*  < > 2.7* 2.8*  < > = values in this interval not displayed. No results for input(s): LIPASE, AMYLASE in the last 72 hours. Cardiac Enzymes  Recent Labs  07/31/2016 1013  08/19/16 0200 08/19/16 0735 08/19/16 1505  CKTOTAL 9,422*  --   --   --   --   TROPONINI  --   < > 7.48* 7.41* 6.78*  < > = values in this interval not displayed.  Recent Labs  08/19/16 1610  DDIMER >20.00*   Radiology/Studies  Dg Chest Port 1 View  Result Date: 08/21/2016 CLINICAL DATA:  Respiratory failure. EXAM: PORTABLE CHEST 1 VIEW COMPARISON:  08/20/2016. FINDINGS: Lines and tubes in stable position. Vascular stent grafts in stable position. Cardiomegaly with slight improvement of bilateral pulmonary infiltrates/edema. Low lung volumes. No prominent pleural effusion or pneumothorax. Moderate gastric distention noted. IMPRESSION: 1. Lines and tubes in stable position. NG tube tip is noted below the hemidiaphragm. There is  moderate gastric distention . 2. Mild cardiomegaly with persistent but slightly improved bilateral pulmonary infiltrates/edema. Low lung volumes. 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 Cardiac arrest.  Neuro assessment pending - EEG to be started today.  She opens eyes, but doesn't follow commands. Supportive care for now. LVEF 50%. Sinus tachycardia, we will start low dose metoprolol 2.5 mg iv Q6H now that she is off levophed.  2. Severe AI:  Manage hemodynamics now.  TEE suggested but would have to see what is her neuro status before considering. No evidence of Eisenmengers physiology on echo.  PA pressure 39 mm Hg.  3. ARF: likely related to arrest, ATN, now on HD, mildly fluid overloaded.    Signed, Ena Dawley MD, [facility] 08/21/2016

## 2016-08-21 NOTE — Procedures (Signed)
Admit: 08/25/2016 LOS: 3  26F with TOF/pHTN admit 10/20 with cardiac arrest, has AKI (unk BL SCr, admit 4.39), shock liver, coaguopathy, persistent AMS  Current CRRT Prescription: Start Date: 10/20 Catheter: L Femoral  BFR: 200 Pre Blood Pump: 200 4K DFR: 2000 4K Replacement Rate: 200 4K Goal UF: Net Even Anticoagulation: none Clotting: none  S: Off Pressors No UOP   O: 10/22 0701 - 10/23 0700 In: 1399.6 [I.V.:100.9; NG/GT:898.7; IV Piggyback:400] Out: 4799 [Urine:15]  Filed Weights   08/19/16 0400 08/20/16 0500 08/21/16 0417  Weight: 55.5 kg (122 lb 5.7 oz) 54 kg (119 lb 0.8 oz) 56.9 kg (125 lb 7.1 oz)     Recent Labs Lab 08/20/16 0400 08/20/16 1400 08/21/16 0450  NA 135 138 139  K 3.8 3.1* 3.4*  CL 99* 103 105  CO2 24 25 27   GLUCOSE 64* 83 117*  BUN 9 9 11   CREATININE 1.46* 1.49* 1.43*  CALCIUM 7.0* 7.3* 7.5*  PHOS 3.2 2.7 1.2*    Recent Labs Lab 08/11/2016 0618  08/19/16 1230 08/19/16 1610 08/20/16 0400 08/21/16 0450  WBC 26.3*  < > 4.4  --  12.8* 15.8*  NEUTROABS 23.1*  --   --   --   --   --   HGB 8.5*  < > 9.9*  --  8.2* 8.0*  HCT 31.4*  < > 30.7*  --  25.3* 25.0*  MCV 79.3  < > 72.4*  --  72.1* 72.9*  PLT 165  < > 58* 52* 48* 44*  < > = values in this interval not displayed.  Scheduled Meds: . chlorhexidine gluconate (MEDLINE KIT)  15 mL Mouth Rinse BID  . famotidine (PEPCID) IV  20 mg Intravenous Q12H  . insulin aspart  2-6 Units Subcutaneous Q4H  . mouth rinse  15 mL Mouth Rinse 10 times per day  . piperacillin-tazobactam (ZOSYN)  IV  2.25 g Intravenous Q6H  . sodium bicarbonate  50 mEq Intravenous Once  . sodium chloride flush  10-40 mL Intracatheter Q12H  . vancomycin  500 mg Intravenous Q24H   Continuous Infusions: . feeding supplement (VITAL AF 1.2 CAL) 1,000 mL (08/20/16 1500)  . norepinephrine (LEVOPHED) Adult infusion Stopped (08/21/16 0736)  . dialysis replacement fluid (prismasate) 200 mL/hr at 08/20/16 1545  . dialysis  replacement fluid (prismasate) 200 mL/hr at 08/20/16 1545  . dialysate (PRISMASATE) 2,000 mL/hr at 08/21/16 0728   PRN Meds:.heparin, sodium chloride, sodium chloride flush  ABG    Component Value Date/Time   PHART 7.477 (H) 08/19/2016 1552   PCO2ART 28.5 (L) 08/19/2016 1552   PO2ART 186.0 (H) 08/19/2016 1552   HCO3 21.5 08/19/2016 1552   TCO2 22 08/19/2016 1552   ACIDBASEDEF 2.0 08/19/2016 1552   O2SAT 100.0 08/19/2016 1552    A 1. Dialysis dependent AKI with unknown baseline SCr after cardiac arrest 2/2 ATN 2. S/p cardiac arrest s/p therapeutic cooling 3. VDRF 4. Shock LIver 5. Coagulopathy / TCP 6.  AMS / Encephalpathy 7. Congenital Heart Disease / TOF, severe AI, prosthetic Valves  P 1. Cont CRRT 4K no heparin at current flow rates, trial of net negative 71m/hr   RPearson Grippe MD [employer] [phone]

## 2016-08-21 NOTE — Progress Notes (Signed)
Nutrition Follow-up  INTERVENTION:   Decrease Vital AF 1.2 to 40 ml/hr (960 ml/day) Add 30 ml Prostat TID Provides: 1452 kcal, 117 grams protein, and 778 ml H2O.   NUTRITION DIAGNOSIS:   Inadequate oral intake related to inability to eat as evidenced by NPO status. Ongoing.   GOAL:   Patient will meet greater than or equal to 90% of their needs Progressing.   MONITOR:   TF tolerance, Skin, I & O's, Labs, Vent status  REASON FOR ASSESSMENT:   Consult Enteral/tube feeding initiation and management  ASSESSMENT:   54F with insomnia, migraines,  Tetralogy of Fallot  admitted with cardiac arrest (PEA then VT/VF) with ROSC, acute encephalopathy, acute respiratory failure, and multiorgan failure with with acute renal failure and liver failure.   Discussed with RN, no family in room.  Started on CVVHD afternoon of 10/20.  TF started 10/22  Patient is currently intubated on ventilator support MV: 7.3 L/min Temp (24hrs), Avg:97.8 F (36.6 C), Min:96.8 F (36 C), Max:98.4 F (36.9 C)  Medications reviewed and include: KPhos, Na bicarb Labs reviewed: K+ 3.4, PO4 1.2, magnesium 2.5, AST/ALT: 1740/1634 CBG (last 3)   Recent Labs  08/21/16 0432 08/21/16 0737 08/21/16 1226  GLUCAP 122* 122* 134*    Diet Order:     Skin:  Wound (see comment) (stage I left heel)  Last BM:  10/21 has rectal tube, no output last 24 hrs  Height:   Ht Readings from Last 1 Encounters:  12-04-2015 5\' 3"  (1.6 m)    Weight:   Wt Readings from Last 1 Encounters:  08/21/16 125 lb 7.1 oz (56.9 kg)    Ideal Body Weight:  52.2 kg  BMI:  Body mass index is 22.22 kg/m.  Estimated Nutritional Needs:   Kcal:  1489  Protein:  100-120 grams  Fluid:  > 1.5 L/day  EDUCATION NEEDS:   No education needs identified at this time  Kendell BaneHeather Kateria Cutrona RD, LDN, CNSC [phone] Pager [phone] After Hours Pager

## 2016-08-21 NOTE — Progress Notes (Signed)
EEG completed; results pending.    

## 2016-08-21 NOTE — Progress Notes (Signed)
Pharmacy Antibiotic Note  Barbara [name] is a 30 y.o. female Posey Reas/p PEA arrest with VDRF. Pharmacy dosing vancomycin and Zosyn for PNA. She is noted with H flu in trach aspirate cultures (beta-lactamase positive). Plans to change to Unasyn and discontinue vancomycin (discussed with Danford BadKathryn Whiteheart NP). Patient is noted on CRRT.    Plan: -Unasyn 3gm IV q8h -Discontinue vancomycin -Will follow renal function and clinical progress   Height: 5\' 3"  (160 cm) Weight: 125 lb 7.1 oz (56.9 kg) IBW/kg (Calculated) : 52.4  Temp (24hrs), Avg:97.5 F (36.4 C), Min:96.8 F (36 C), Max:98.2 F (36.8 C)   Recent Labs Lab 2016-09-10 0634 2016-09-10 1047 2016-09-10 1051  08/19/16 0411  08/19/16 1230  08/19/16 1610 08/19/16 2000 08/20/16 0001 08/20/16 0400 08/20/16 1400 08/21/16 0450  WBC  --   --  18.8*  --  3.3*  --  4.4  --   --   --   --  12.8*  --  15.8*  CREATININE 4.40*  --  4.06*  < > 2.04*  < >  --   < >  --  1.41* 1.41* 1.46* 1.49* 1.43*  LATICACIDVEN 16.42* 13.8*  --   --   --   --   --   --  4.3*  --  4.3*  --   --   --   < > = values in this interval not displayed.  Estimated Creatinine Clearance: 48 mL/min (by C-G formula based on SCr of 1.43 mg/dL (H)).    Allergies  Allergen Reactions  . Latex Rash    Antimicrobials this admission: Unasyn 10/23>>  Vanc 10/20>> 10/23  Zosyn 10/20>> 10/23  Dose adjustments this admission:  none  Microbiology results:   10/20 blood x 2 - ngtd  10/20 trach aspirate - abundant H flu (B lac positive)  10/20 MRSA screen - negative  Thank you for allowing pharmacy to be a part of this patient's care. Harland GermanAndrew Kearah Gayden, Pharm D 08/21/2016 11:17 AM

## 2016-08-21 NOTE — Progress Notes (Signed)
Pt. transported to/from MRI w/o incident, RT to monitor.
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
## 2016-08-22 ENCOUNTER — Encounter (HOSPITAL_COMMUNITY): Payer: Self-pay | Admitting: *Deleted

## 2016-08-22 DIAGNOSIS — J9601 Acute respiratory failure with hypoxia: Secondary | ICD-10-CM

## 2016-08-22 DIAGNOSIS — J189 Pneumonia, unspecified organism: Secondary | ICD-10-CM

## 2016-08-22 LAB — GLUCOSE, CAPILLARY
GLUCOSE-CAPILLARY: 83 mg/dL (ref 65–99)
Glucose-Capillary: 120 mg/dL — ABNORMAL HIGH (ref 65–99)

## 2016-08-22 LAB — POCT I-STAT 3, ART BLOOD GAS (G3+)
Acid-base deficit: <30 mmol/L — ABNORMAL HIGH (ref 0.0–2.0)
Bicarbonate: 3.3 mmol/L — ABNORMAL LOW (ref 20.0–24.0)
O2 SAT: 100 % (ref 90.0–100.0)
PCO2 ART: 19.7 mmHg — AB (ref 35.0–45.0)
PO2 ART: 406 mmHg — AB (ref 83.0–108.0)
Patient temperature: 32
TCO2: <5 mmol/L (ref 0–100)
pH, Arterial: 6.783 — ABNORMAL LOW (ref 7.350–7.400)

## 2016-08-22 LAB — BASIC METABOLIC PANEL
Anion gap: 9 (ref 5–15)
BUN: 17 mg/dL (ref 6–20)
CO2: 23 mmol/L (ref 22–32)
Calcium: 7.6 mg/dL — ABNORMAL LOW (ref 8.9–10.3)
Chloride: 102 mmol/L (ref 101–111)
Creatinine, Ser: 1.66 mg/dL — ABNORMAL HIGH (ref 0.44–1.00)
GFR calc Af Amer: 47 mL/min — ABNORMAL LOW (ref >60)
GFR calc non Af Amer: 41 mL/min — ABNORMAL LOW (ref >60)
GLUCOSE: 118 mg/dL — AB (ref 65–99)
POTASSIUM: 4.1 mmol/L (ref 3.5–5.1)
Sodium: 134 mmol/L — ABNORMAL LOW (ref 135–145)

## 2016-08-22 LAB — CBC
HCT: 26.2 % — ABNORMAL LOW (ref 36.0–46.0)
HEMOGLOBIN: 8.4 g/dL — AB (ref 12.0–15.0)
MCH: 24 pg — AB (ref 26.0–34.0)
MCHC: 32.1 g/dL (ref 30.0–36.0)
MCV: 74.9 fL — AB (ref 78.0–100.0)
Platelets: 31 10*3/uL — ABNORMAL LOW (ref 150–400)
RBC: 3.5 MIL/uL — ABNORMAL LOW (ref 3.87–5.11)
RDW: 21.4 % — ABNORMAL HIGH (ref 11.5–15.5)
WBC: 15.5 10*3/uL — ABNORMAL HIGH (ref 4.0–10.5)

## 2016-08-22 LAB — MAGNESIUM: Magnesium: 2.5 mg/dL — ABNORMAL HIGH (ref 1.7–2.4)

## 2016-08-22 LAB — PHOSPHORUS: Phosphorus: 2.2 mg/dL — ABNORMAL LOW (ref 2.5–4.6)

## 2016-08-22 LAB — ALBUMIN: Albumin: 2.8 g/dL — ABNORMAL LOW (ref 3.5–5.0)

## 2016-08-22 MED ORDER — INSULIN ASPART 100 UNIT/ML ~~LOC~~ SOLN: 0.0000 [IU] | SUBCUTANEOUS | Status: DC

## 2016-08-22 MED ORDER — METOPROLOL TARTRATE 5 MG/5ML IV SOLN: 5.0000 mg | Freq: Four times a day (QID) | INTRAVENOUS | Status: DC

## 2016-08-22 MED ORDER — SODIUM CHLORIDE 0.9 % IV SOLN
10.0000 mg/h | INTRAVENOUS | Status: DC
  Administered 2016-08-22: 35 mg/h via INTRAVENOUS
  Administered 2016-08-22: 5 mg/h via INTRAVENOUS
  Filled 2016-08-22 (×2): qty 10

## 2016-08-22 MED ORDER — MORPHINE BOLUS VIA INFUSION
5.0000 mg | INTRAVENOUS | Status: DC | PRN
  Administered 2016-08-22 (×2): 10 mg via INTRAVENOUS
  Administered 2016-08-22: 5 mg via INTRAVENOUS
  Filled 2016-08-22 (×2): qty 20

## 2016-08-22 MED ORDER — ATROPINE SULFATE 1 % OP SOLN
2.0000 [drp] | Freq: Four times a day (QID) | OPHTHALMIC | Status: DC | PRN
  Filled 2016-08-22: qty 2

## 2016-08-23 LAB — CULTURE, BLOOD (ROUTINE X 2)
Culture: NO GROWTH
Culture: NO GROWTH

## 2016-08-27 LAB — URINE DRUGS OF ABUSE SCREEN W ALC, ROUTINE (REF LAB)
AMPHETAMINES, URINE: NEGATIVE ng/mL
Barbiturate, Ur: NEGATIVE ng/mL
Cocaine (Metab.): NEGATIVE ng/mL
ETHANOL U, QUAN: NEGATIVE %
METHADONE SCREEN, URINE: NEGATIVE ng/mL
Opiate Quant, Ur: NEGATIVE ng/mL
PHENCYCLIDINE, UR: NEGATIVE ng/mL
PROPOXYPHENE, URINE: NEGATIVE ng/mL

## 2016-08-27 LAB — DRUG PROFILE 799031
BENZODIAZEPINES: POSITIVE — AB
HYDROXYALPRAZOLAM: NEGATIVE
NORDIAZEPAM GC/MS CONF: 814 ng/mL
NORDIAZEPAM: POSITIVE — AB
OXAZEPAM GC/MS CONF: 4970 ng/mL
Oxazepam: POSITIVE — AB

## 2016-08-27 LAB — PANEL 799049
CANNABINOID GC/MS UR: POSITIVE — AB
CARBOXY THC GC/MS CONF: 55 ng/mL

## 2016-08-28 ENCOUNTER — Telehealth: Payer: Self-pay

## 2016-08-28 NOTE — Telephone Encounter (Signed)
On 08/28/2016 I received a death certificate from [employer] (orginal). The death certificate is for cremation. The patient is a patient of Designer, television/film setDoctor Nestor. The [facility] ([address] this pm for signature.  On 08/29/2016 I received the death certificate back from Doctor [employer]est[employer]. I got the death certificate ready and called the funeral home to let them know the death certificate is ready for pickup.

## 2016-08-30 NOTE — Procedures (Signed)
Admit: 08/10/2016 LOS: 4  59F with TOF/pHTN admit 10/20 with cardiac arrest, has AKI (unk BL SCr, admit 4.39), shock liver, coaguopathy, persistent AMS  Current CRRT Prescription: Start Date: 10/20 Catheter: L Femoral  BFR: 200 Pre Blood Pump: 200 4K DFR: 2000 4K Replacement Rate: 200 4K Goal UF: Net Even Anticoagulation: none Clotting: none  S: EEG and MRI suggest significan anoxic injury No UOP Tol UF yesterday Cousin in room, they are likely to transition to comfort measures   O: 10/23 0701 - 10/24 0700 In: 1943.7 [I.V.:15; NG/GT:1012.7; IV Piggyback:866] Out: 8206 [Urine:9; Stool:300]  Filed Weights   08/20/16 0500 08/21/16 0417 09/19/16 0400  Weight: 54 kg (119 lb 0.8 oz) 56.9 kg (125 lb 7.1 oz) 53.2 kg (117 lb 4.6 oz)     Recent Labs Lab 08/21/16 0450 08/21/16 1900 09-19-2016 0400  NA 139 136 134*  K 3.4* 4.2 4.1  CL 105 104 102  CO2 27 25 23   GLUCOSE 117* 98 118*  BUN 11 14 17   CREATININE 1.43* 1.48* 1.66*  CALCIUM 7.5* 7.4* 7.6*  PHOS 1.2* 2.2* 2.2*    Recent Labs Lab 08/02/2016 0618  08/20/16 0400 08/21/16 0450 Sep 19, 2016 0400  WBC 26.3*  < > 12.8* 15.8* 15.5*  NEUTROABS 23.1*  --   --   --   --   HGB 8.5*  < > 8.2* 8.0* 8.4*  HCT 31.4*  < > 25.3* 25.0* 26.2*  MCV 79.3  < > 72.1* 72.9* 74.9*  PLT 165  < > 48* 44* 31*  < > = values in this interval not displayed.  Scheduled Meds: . ampicillin-sulbactam (UNASYN) IV  3 g Intravenous Q8H  . chlorhexidine gluconate (MEDLINE KIT)  15 mL Mouth Rinse BID  . famotidine (PEPCID) IV  20 mg Intravenous Q12H  . feeding supplement (PRO-STAT SUGAR FREE 64)  30 mL Per Tube TID  . insulin aspart  2-6 Units Subcutaneous Q4H  . mouth rinse  15 mL Mouth Rinse 10 times per day  . metoprolol  2.5 mg Intravenous Q6H  . sodium bicarbonate  50 mEq Intravenous Once  . sodium chloride flush  10-40 mL Intracatheter Q12H   Continuous Infusions: . feeding supplement (VITAL AF 1.2 CAL) 1,000 mL (08/21/16 2256)  .  norepinephrine (LEVOPHED) Adult infusion Stopped (08/21/16 1900)  . dialysis replacement fluid (prismasate) 200 mL/hr at 08/20/16 1545  . dialysis replacement fluid (prismasate) 200 mL/hr at 08/20/16 1545  . dialysate (PRISMASATE) 2,000 mL/hr at 2016/09/19 0821   PRN Meds:.heparin, sodium chloride, sodium chloride flush  ABG    Component Value Date/Time   PHART 7.477 (H) 08/19/2016 1552   PCO2ART 28.5 (L) 08/19/2016 1552   PO2ART 186.0 (H) 08/19/2016 1552   HCO3 21.5 08/19/2016 1552   TCO2 22 08/19/2016 1552   ACIDBASEDEF 2.0 08/19/2016 1552   O2SAT 100.0 08/19/2016 1552    A 1. Dialysis dependent AKI with unknown baseline SCr after cardiac arrest 2/2 ATN 2. S/p cardiac arrest s/p therapeutic cooling 3. Significant anoxic brain injury 4. VDRF 5. Shock LIver 6. Coagulopathy / TCP 7.  AMS / Encephalpathy 8. Congenital Heart Disease / TOF, severe AI, prosthetic Valves  P 1. Cont CRRT 4K no heparin at current flow rates, trial of net negative 76m/hr  2. Goals of care with family  RPearson Grippe MD [facility] pgr [phone]

## 2016-08-30 NOTE — Progress Notes (Addendum)
Patient Name: Allianna Beaubien Date of Encounter: Aug 31, 2016  Active Problems:   Cardiac arrest ([place])   Acute renal failure (HCC)   Hyperkalemia   Coagulopathy (HCC)   Shock liver   Acute encephalopathy   Lactic acidosis   Elevated troponin   Heart murmur   ARDS (adult respiratory distress syndrome) (HCC)   TOF (tetralogy of Fallot)   Acute respiratory failure with hypoxia (HCC)   Pressure injury of skin   Length of Stay: 4  SUBJECTIVE  The patient continues to be on hemodialysis, on pressors and sedation, opens eyes has positive gag reflex, and withdraws to pain stimuli but otherwise no response.   CURRENT MEDS . ampicillin-sulbactam (UNASYN) IV  3 g Intravenous Q8H  . chlorhexidine gluconate (MEDLINE KIT)  15 mL Mouth Rinse BID  . famotidine (PEPCID) IV  20 mg Intravenous Q12H  . feeding supplement (PRO-STAT SUGAR FREE 64)  30 mL Per Tube TID  . insulin aspart  2-6 Units Subcutaneous Q4H  . mouth rinse  15 mL Mouth Rinse 10 times per day  . metoprolol  2.5 mg Intravenous Q6H  . sodium bicarbonate  50 mEq Intravenous Once  . sodium chloride flush  10-40 mL Intracatheter Q12H   . feeding supplement (VITAL AF 1.2 CAL) 1,000 mL (08/21/16 2256)  . norepinephrine (LEVOPHED) Adult infusion Stopped (08/21/16 1900)  . dialysis replacement fluid (prismasate) 200 mL/hr at 08/20/16 1545  . dialysis replacement fluid (prismasate) 200 mL/hr at 08/20/16 1545  . dialysate (PRISMASATE) 2,000 mL/hr at Aug 31, 2016 0821   OBJECTIVE  Vitals:   August 31, 2016 0500 2016/08/31 0600 08/31/2016 0700 31-Aug-2016 0800  BP:      Pulse: 85 78 80 87  Resp: 18 (!) 24 18 (!) 24  Temp:    98.5 F (36.9 C)  TempSrc:    Axillary  SpO2: 100% 100% 99% 100%  Weight:      Height:        Intake/Output Summary (Last 24 hours) at Aug 31, 2016 0841 Last data filed at 08-31-2016 0800  Gross per 24 hour  Intake          1932.38 ml  Output             3213 ml  Net         -1280.62 ml   Filed Weights   08/20/16 0500  08/21/16 0417 August 31, 2016 0400  Weight: 119 lb 0.8 oz (54 kg) 125 lb 7.1 oz (56.9 kg) 117 lb 4.6 oz (53.2 kg)   PHYSICAL EXAM  General: intubated, opens eyes, pupils equal, responsive B/L Neck: Supple without bruits or JVD. Lungs:  Resp regular and unlabored, crackles B/L Heart: RRR no s3, s4, 3/6 diastolic murmur. Abdomen: Soft, non-tender, non-distended, BS + x 4.  Extremities: No clubbing, cyanosis or edema. DP/PT/Radials 2+ and equal bilaterally.  Accessory Clinical Findings  CBC  Recent Labs  08/21/16 0450 2016/08/31 0400  WBC 15.8* 15.5*  HGB 8.0* 8.4*  HCT 25.0* 26.2*  MCV 72.9* 74.9*  PLT 44* 31*   Basic Metabolic Panel  Recent Labs  08/21/16 1900 Aug 31, 2016 0400  NA 136 134*  K 4.2 4.1  CL 104 102  CO2 25 23  GLUCOSE 98 118*  BUN 14 17  CREATININE 1.48* 1.66*  CALCIUM 7.4* 7.6*  MG 2.2 2.5*  PHOS 2.2* 2.2*   Liver Function Tests  Recent Labs  08/21/16 0450 08/21/16 1900 08/31/2016 0400  AST 1,740*  --   --   ALT 1,634*  --   --  ALKPHOS 94  --   --   BILITOT 3.9*  --   --   PROT 5.3*  --   --   ALBUMIN 2.8* 2.8* 2.8*    Recent Labs  08/19/16 1505  TROPONINI 6.78*    Recent Labs  08/19/16 1610  DDIMER >20.00*   Radiology/Studies  Dg Chest Port 1 View  Result Date: 08/21/2016 CLINICAL DATA:  Respiratory failure. EXAM: PORTABLE CHEST 1 VIEW COMPARISON:  08/20/2016. FINDINGS: Lines and tubes in stable position. Vascular stent grafts in stable position. Cardiomegaly with slight improvement of bilateral pulmonary infiltrates/edema. Low lung volumes. No prominent pleural effusion or pneumothorax. Moderate gastric distention noted. IMPRESSION: 1. Lines and tubes in stable position. NG tube tip is noted below the hemidiaphragm. There is moderate gastric distention . 2. Mild cardiomegaly with persistent but slightly improved bilateral pulmonary infiltrates/edema. Low lung volumes. Electronically Signed   By: Marcello Moores  Register     TELE: ST, ns VT - 3  beats    ASSESSMENT AND PLAN  30 y/o with prior Tetralogy of Fallot repair  1. Cardiac arrest.  Neuro assessment pending - EEG markedly abnormal due to diffuse background suppression and lack of EEG reactivity with noxious stimulation. 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  She is hemodynamically stable now tachycardic and hypertensive, we will increase Lopressor 5 mg every 6 hours.  Overall management will be driven by neurologic recovering currently prognosis very poor, family meeting scheduled for later today.  2. Severe AI:  Manage hemodynamics now.  TEE suggested but would have to see what is her neuro status before considering. No evidence of Eisenmengers physiology on echo.  PA pressure 39 mm Hg.  3. ARF: likely related to arrest, ATN, now on HD, minimally fluid overloaded.  4. Coagulopathy - platelet 48--> 41--> 31, blood in stool and urine, Hb stable at 8.4.   Signed, Ena Dawley MD, [facility] 08-28-16

## 2016-08-30 NOTE — Procedures (Signed)
Extubation Procedure Note  Patient Details:   Name: [name] DOB: Mar 01, 1986 MRN: [id number]   Airway Documentation:     Evaluation  O2 sats: transiently fell during during procedure Complications: No apparent complications Patient did tolerate procedure well. Bilateral Breath Sounds: Rhonchi, Coarse crackles   No   Pt extubated per Withdraw of life protocol to Room Air. RT will continue to monitor.   Ermalinda BarriosKelley, Rishit Burkhalter M 01/24/2016, 2:18 PM
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
## 2016-08-30 NOTE — Plan of Care (Signed)
  Interdisciplinary Goals of Care Family Meeting   Date carried out:: May 06, 2016  Location of the meeting: Bedside  Member's involved: Physician, Bedside Registered Nurse and Family Member or next of kin  Durable Power of Attorney or acting medical decision maker: Mother   [name]  Discussion: We discussed goals of care [name] .  I discussed the findings with the patient's MRI which were consistent with hypoxic ischemic encephalopathy/anoxic brain injury along with findings on EEG consistent with the same. Mother reports that the patient would never have wish to be intubated or be sustained on her current level of support with dialysis and ventilator. She is going to contact additional family and friends to allow them time to arrive and be present today as we transition to full comfort care. I informed her we would be using morphine to relieve any pain or dyspnea that her body may be feeling as we transition to a terminal vent wean and one way extubation. We also discussed referring the patient for a medical examiner evaluation given her history on presentation. We will leave the remainder of the patient's lines and catheters in place for the medical examiner but again be extubating the patient for her comfort. She will notify the bedside nurse 1 she is ready to proceed with her terminal vent wean and extubation. Hospital chaplain has already been consulted and is providing support for the family.  Code status: Full DNR  Disposition: In-patient comfort care  Time spent for the meeting: 13 minutes   Barbara CousinsJennings Ariely [name] May 06, 2016, 11:19 AM

## 2016-08-30 NOTE — Progress Notes (Signed)
   09/03/2016 1300  Clinical Encounter Type  Visited With Family;Health care provider  Visit Type Follow-up;Spiritual support;Other (Comment) (comfort care)  Referral From Nurse  Recommendations continue to check in with family   Stress Factors  Patient Stress Factors None identified  Family Stress Factors Exhausted;Family relationships;Loss    Chaplain responded to page from on call pager. Was in ED when pt admitted on Friday. Met with nurse and spoke with pt mother regarding family needs during this time. Mother finds spiritual meaning in numbers and waiting until those numbers make sense to withdraw care. Provided spiritual support, listening, and prayer.

## 2016-08-30 NOTE — Progress Notes (Signed)
Patient passed at 19:24.  Was full comfort care, DNR, and on morphine gtt at time of passing.  Family and nurse at bedside.  235 ml of morphine wasted in sink with Barbara Fortisaroline Schenck, RN.  MD notified and CDS called. Pt. Was cleaned and family is visiting now.

## 2016-08-30 NOTE — Progress Notes (Signed)
Encountered pt's mom in cafeteria, and exchanged words of comfort and support. She expects results later today from doctors. Encouraged her to page for a chaplain if/when facing end of life issues for her daughter. Chaplain available for follow-up.   08/23/2016 0945  Clinical Encounter Type  Visited With Family  Visit Type Follow-up;Psychological support;Spiritual support  Referral From Chaplain  Spiritual Encounters  Spiritual Needs Emotional;Grief support  Stress Factors  Patient Stress Factors Health changes;Loss of control;Major life changes  Family Stress Factors Family relationships;Loss;Loss of control
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
## 2016-08-30 DEATH — deceased

## 2017-06-17 IMAGING — MR MR HEAD W/O CM
9 of 10 series · 38 of 48 positions shown · non-contrast
Comparison: None.

CLINICAL DATA: Cardiac arrest [REDACTED], found down. Evaluate
anoxic brain injury. History of tetralogy of Fallon.

EXAM:
MRI HEAD WITHOUT CONTRAST
TECHNIQUE: Multiplanar, multiecho pulse sequences of the brain and surrounding
structures were obtained without intravenous contrast.

[Series 3: DWI · axial · 3.0mm · 1.09mm/px · z∈[-65,+46]mm · 10 of 88 slices shown (1 of 4)]
[im 1/88]
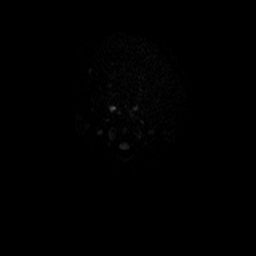
[im 10/88]
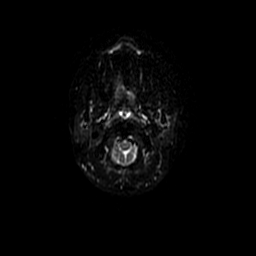
[im 20/88]
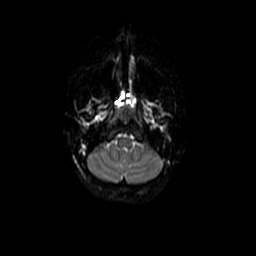
[im 30/88]
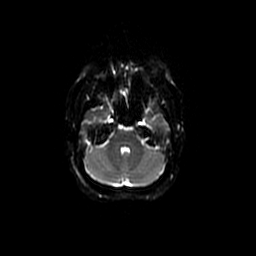
[im 39/88]
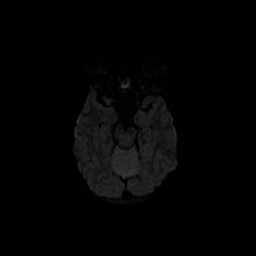
[im 49/88]
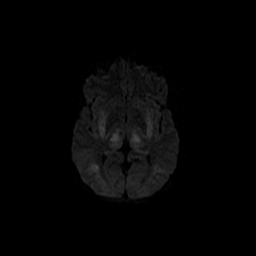
[im 59/88]
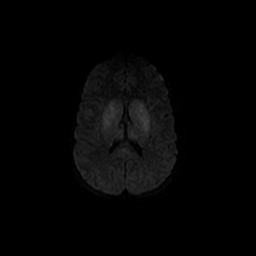
[im 68/88]
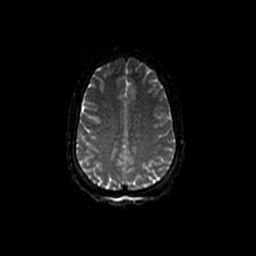
[im 78/88]
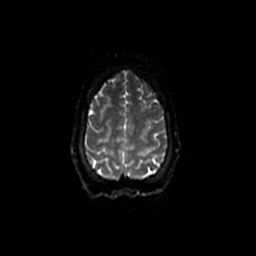
[im 88/88]
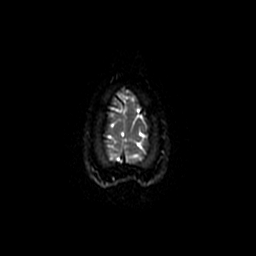

[Series 4: T2 · axial · 5.0mm · 0.47mm/px · z∈[-56,+52]mm · 2 of 22 slices shown (1 of 2)]
[im 1/22]
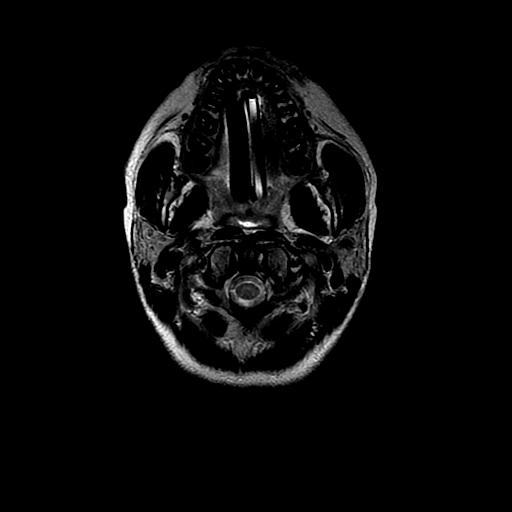
[im 22/22]
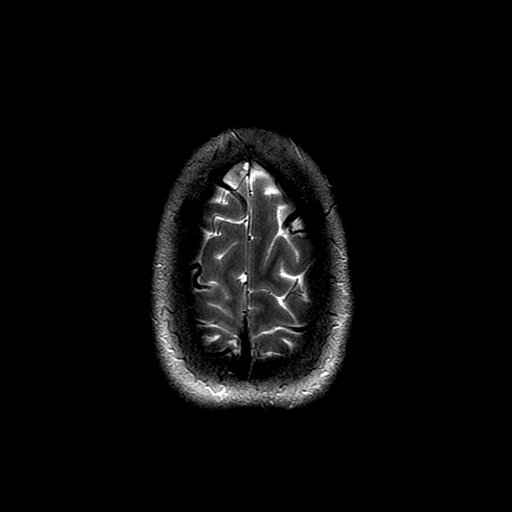

[Series 5: ax mpgr · axial · 5.0mm · 0.47mm/px · z∈[-56,+52]mm · 2 of 22 slices shown]
[im 1/22]
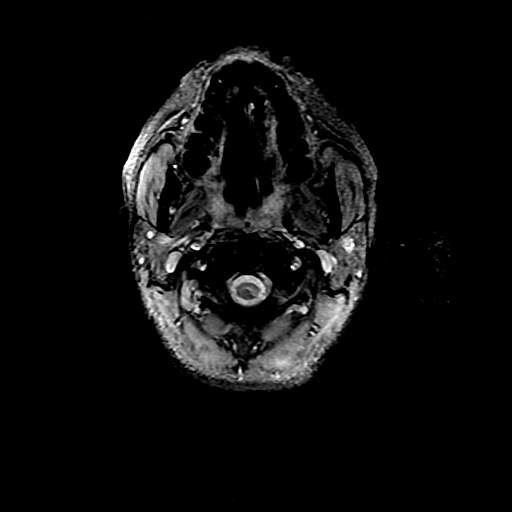
[im 22/22]
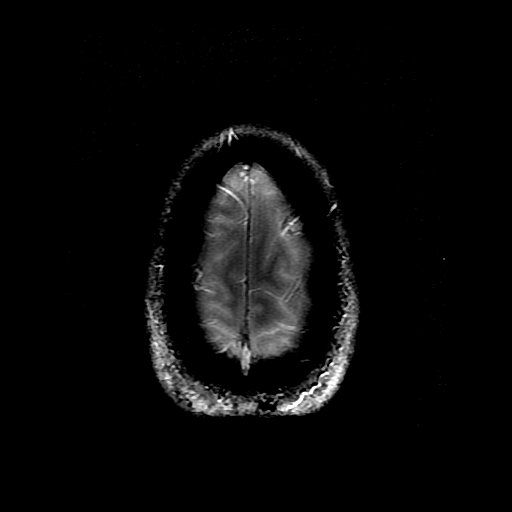

[Series 6: FLAIR · axial · 5.0mm · 0.47mm/px · z∈[-56,+52]mm · 2 of 22 slices shown]
[im 1/22]
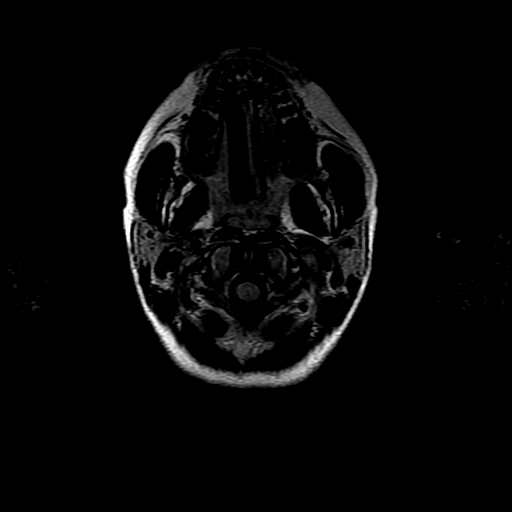
[im 22/22]
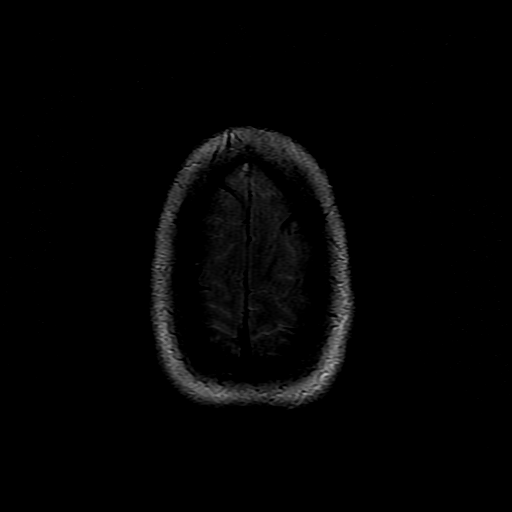

[Series 7: DWI · coronal · 5.0mm · 1.09mm/px · 8 of 68 slices shown (2 of 4)]
[im 1/68]
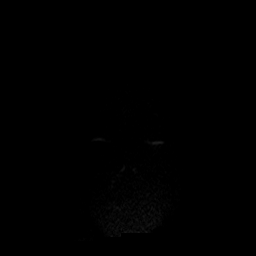
[im 10/68]
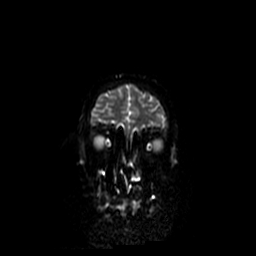
[im 20/68]
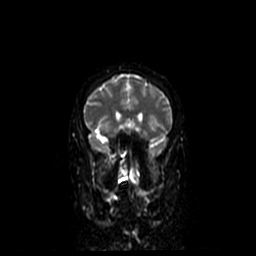
[im 29/68]
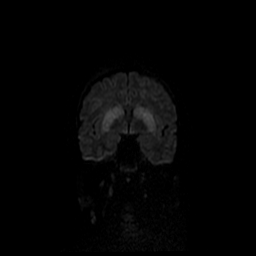
[im 39/68]
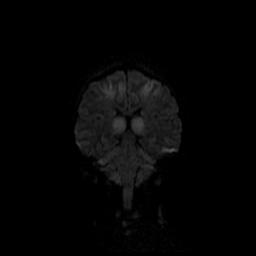
[im 48/68]
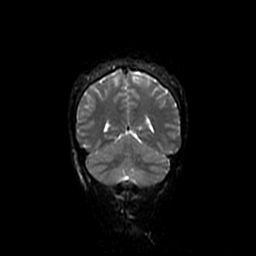
[im 58/68]
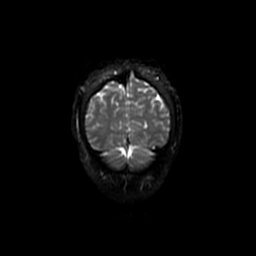
[im 68/68]
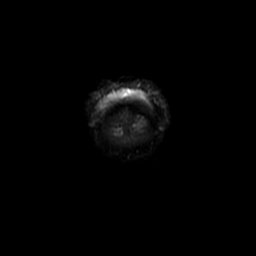

[Series 8: T1 · sagittal · 5.0mm · 0.47mm/px · 2 of 21 slices shown]
[im 1/21]
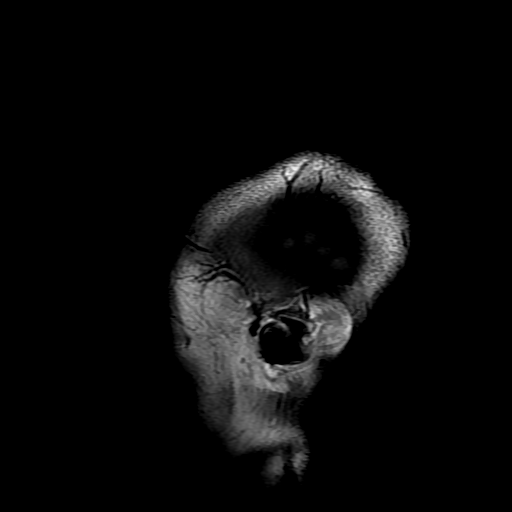
[im 21/21]
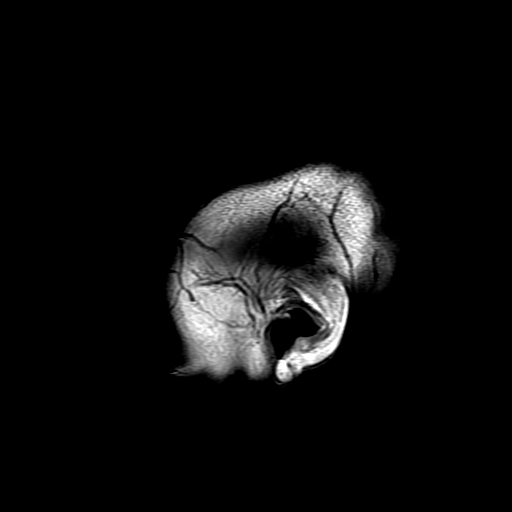

[Series 10: T2 · coronal · 5.0mm · 0.39mm/px · 3 of 26 slices shown (2 of 2)]
[im 1/26]
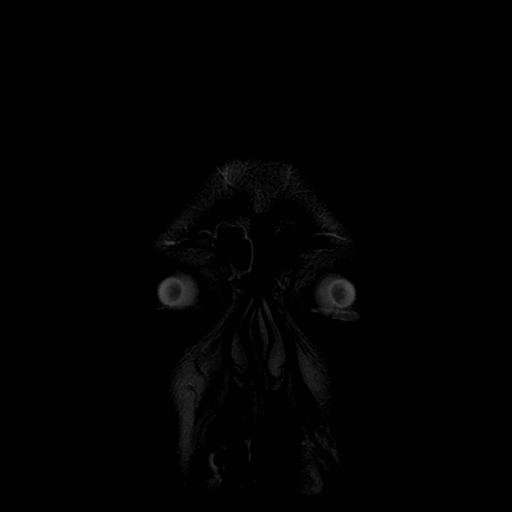
[im 13/26]
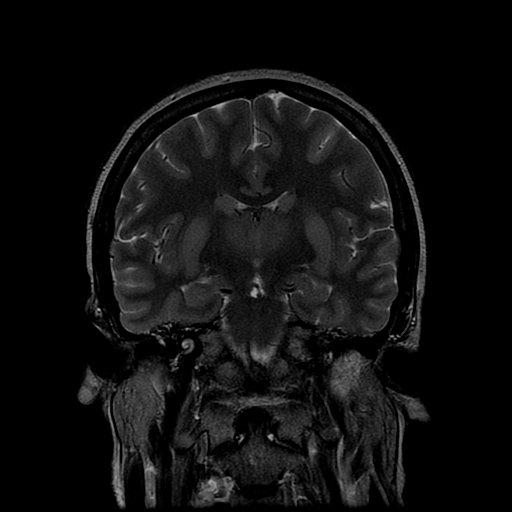
[im 26/26]
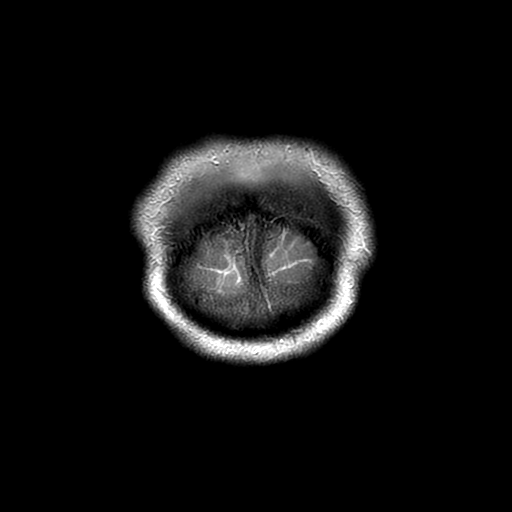

[Series 300: DWI · axial · 3.0mm · 1.09mm/px · z∈[-65,+46]mm · 5 of 44 slices shown (3 of 4)]
[im 1/44]
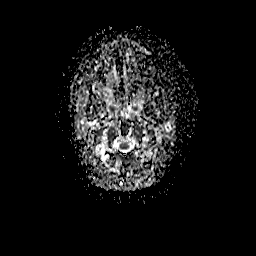
[im 11/44]
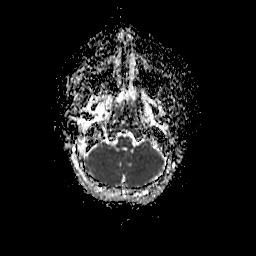
[im 22/44]
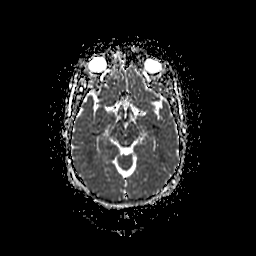
[im 33/44]
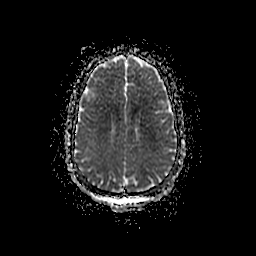
[im 44/44]
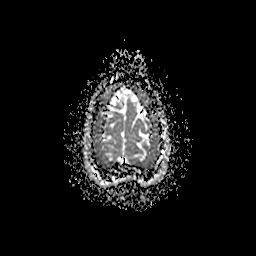

[Series 700: DWI · coronal · 5.0mm · 1.09mm/px · 4 of 33 slices shown (4 of 4)]
[im 1/33]
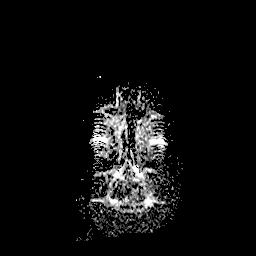
[im 11/33]
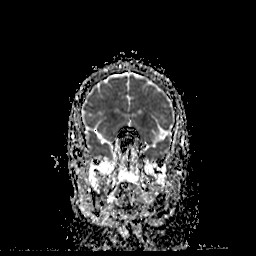
[im 22/33]
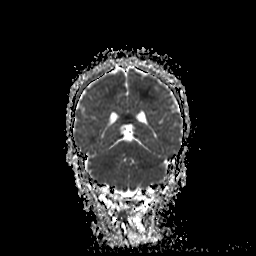
[im 33/33]
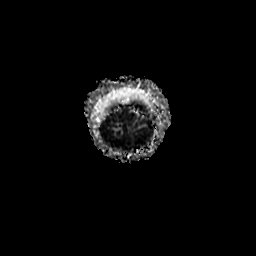

[38 of 48 positions shown; findings below may reference images not displayed]

FINDINGS: BRAIN: Symmetric reduced diffusion within the bilateral basal
ganglia, thalamic, patchy predominately cortical based
supratentorial reduced diffusion, low ADC values. Tiny peripheral
foci of susceptibility artifact throughout the super and
infratentorial brain. The ventricles and sulci are normal for
patient's age. No suspicious parenchymal signal, masses or mass
effect. No abnormal extra-axial fluid collections. No extra-axial
masses though, contrast enhanced sequences would be more sensitive.

VASCULAR: Normal major intracranial vascular flow voids present at
skull base.

SKULL AND UPPER CERVICAL SPINE: No abnormal sellar expansion. No
suspicious calvarial bone marrow signal. Craniocervical junction
maintained.

SINUSES/ORBITS: Paranasal sinus mucosal thickening the mastoid
effusions. Life-support lines in place. The included ocular globes
and orbital contents are non-suspicious.

OTHER: Cerebellar tonsils descend 7 mm below the foramen magnum,
effacing the cerebral spinal fluid signal.
IMPRESSION: MR findings consistent with hypoxic ischemic encephalopathy/anoxic
brain injury.

Chart 1 malformation.
